# Patient Record
Sex: Male | Born: 1955 | Race: Black or African American | Hispanic: No | Marital: Married | Smoking: Never smoker
Health system: Southern US, Community
[De-identification: ages and names within clinical notes are randomized; demographics above are authoritative.]

## PROBLEM LIST (undated history)

## (undated) ENCOUNTER — Emergency Department (HOSPITAL_COMMUNITY): Admission: EM | Payer: Medicare HMO | Source: Home / Self Care

## (undated) DIAGNOSIS — C61 Malignant neoplasm of prostate: Secondary | ICD-10-CM

## (undated) HISTORY — PX: PROSTATE BIOPSY: SHX241

---

## 1998-10-21 ENCOUNTER — Other Ambulatory Visit: Admission: RE | Admit: 1998-10-21 | Discharge: 1998-10-21 | Payer: Self-pay | Admitting: Podiatry

## 2004-12-05 ENCOUNTER — Emergency Department (HOSPITAL_COMMUNITY): Admission: EM | Admit: 2004-12-05 | Discharge: 2004-12-05 | Payer: Self-pay | Admitting: Emergency Medicine

## 2006-09-20 ENCOUNTER — Emergency Department (HOSPITAL_COMMUNITY): Admission: EM | Admit: 2006-09-20 | Discharge: 2006-09-21 | Payer: Self-pay | Admitting: Emergency Medicine

## 2007-09-29 ENCOUNTER — Emergency Department (HOSPITAL_COMMUNITY): Admission: EM | Admit: 2007-09-29 | Discharge: 2007-09-29 | Payer: Self-pay | Admitting: Emergency Medicine

## 2011-11-23 ENCOUNTER — Emergency Department (HOSPITAL_COMMUNITY): Payer: Self-pay

## 2011-11-23 ENCOUNTER — Other Ambulatory Visit: Payer: Self-pay

## 2011-11-23 ENCOUNTER — Encounter (HOSPITAL_COMMUNITY): Payer: Self-pay | Admitting: Emergency Medicine

## 2011-11-23 ENCOUNTER — Emergency Department (HOSPITAL_COMMUNITY)
Admission: EM | Admit: 2011-11-23 | Discharge: 2011-11-23 | Disposition: A | Discharge status: Home Health | Payer: Self-pay | Attending: Emergency Medicine | Admitting: Emergency Medicine

## 2011-11-23 DIAGNOSIS — R200 Anesthesia of skin: Secondary | ICD-10-CM

## 2011-11-23 DIAGNOSIS — R209 Unspecified disturbances of skin sensation: Secondary | ICD-10-CM | POA: Insufficient documentation

## 2011-11-23 LAB — CBC
HCT: 44.2 % (ref 39.0–52.0)
Hemoglobin: 14.8 g/dL (ref 13.0–17.0)
MCH: 30.6 pg (ref 26.0–34.0)
MCHC: 33.5 g/dL (ref 30.0–36.0)
MCV: 91.3 fL (ref 78.0–100.0)
Platelets: 202 10*3/uL (ref 150–400)
RBC: 4.84 MIL/uL (ref 4.22–5.81)
RDW: 13.8 % (ref 11.5–15.5)
WBC: 6.5 10*3/uL (ref 4.0–10.5)

## 2011-11-23 LAB — BASIC METABOLIC PANEL
BUN: 14 mg/dL (ref 6–23)
CO2: 29 mEq/L (ref 19–32)
Calcium: 9.3 mg/dL (ref 8.4–10.5)
Chloride: 103 mEq/L (ref 96–112)
Creatinine, Ser: 1.07 mg/dL (ref 0.50–1.35)
GFR calc Af Amer: 87 mL/min — ABNORMAL LOW (ref >90)
GFR calc non Af Amer: 75 mL/min — ABNORMAL LOW (ref >90)
Glucose, Bld: 110 mg/dL — ABNORMAL HIGH (ref 70–99)
Potassium: 3.8 mEq/L (ref 3.5–5.1)
Sodium: 139 mEq/L (ref 135–145)

## 2011-11-23 NOTE — ED Notes (Signed)
Pt alert, nad, c/o facila numbness, eyes watering, onset several months ago, pt ambulates to triage, steady gait, noted, speech clear, grips equal

## 2011-11-23 NOTE — Discharge Instructions (Signed)
Neuropathy Neuropathy means your peripheral nerves are not working normally. Peripheral nerves are the nerves outside the brain and spinal cord. Messages between the brain and the rest of the body do not work properly with peripheral nerve disorders. CAUSES There are many different causes of peripheral nerve disorders. These include:  Injury.   Infections.   Diabetes.   Vitamin deficiency.   Poor circulation.   Alcoholism.   Exposure to toxins.   Drug effects.   Tumors.   Kidney disease.  SYMPTOMS  Tingling, burning, pain, and numbness in the extremities.   Weakness and loss of muscle tone and size.  DIAGNOSIS Blood tests and special studies of nerve function may help confirm the diagnosis.  TREATMENT  Treatment includes adopting healthy life habits.   A good diet, vitamin supplements, and mild pain medicine may be needed.   Avoid known toxins such as alcohol, tobacco, and recreational drugs.   Anti-convulsant medicines are helpful in some types of neuropathy.  Make a follow-up appointment with your caregiver to be sure you are getting better with treatment.  SEEK IMMEDIATE MEDICAL CARE IF:   You have breathing problems.   You have severe or uncontrolled pain.   You notice extreme weakness or you feel faint.   You are not better after 1 week or if you have worse symptoms.  Document Released: 10/19/2004 Document Revised: 05/24/2011 Document Reviewed: 09/11/2005 [facility] Patient Information 2012 [place], [place].  RESOURCE GUIDE  Dental Problems  Patients with Medicaid: [facility] [phone] [address]                                           [phone] [address] Phone:  [phone]                                                  Phone:  [phone]  If unable to pay or uninsured, contact:  [facility] or [facility]. to become qualified for the adult dental clinic.  Chronic Pain  Problems Contact [facility]  [phone] Patients need to be referred by their primary care doctor.  Insufficient Money for Medicine Contact [employer] Way:  call "211" or [facility] [phone].  No Primary Care Doctor Call [facility]  [phone] Other agencies that provide inexpensive medical care    [facility]  [phone]    [facility]  [phone]    [facility]  [phone]    [facility]  [phone]    [facility]  [phone]    [facility]  [phone]  Psychological Services [facility]  [phone] [facility] Services  [phone] [facility] Mental [facility]   [phone] (emergency services [phone])  Substance Abuse Resources [facility]  [phone] [facility] [phone] [facility] [phone] Floydene Flock [phone] Residential & Outpatient Substance Abuse Program  [phone]  Abuse/Neglect [facility] Child Abuse Hotline 480-267-6739 [facility] Child Abuse Hotline [phone] (After Hours)  Emergency Shelter [facility] [phone]  Maternity Homes [facility] at [facility] [place] [facility]  [place] [phone] [employer] Services [phone]  MRSA Hotline #:   727-803-9967    [facility] Resources  [facility]     [employer]                          [facility] Dept. [address] [place]                       [address]      [address]  Blondell Reveal Phone:  [phone]                                   Phone:  [phone]                 Phone:  [phone]  [facility] Mental Health Phone:  [phone]  [facility] Child Abuse Hotline 916-171-9086 [phone] (After Hours)

## 2011-11-23 NOTE — ED Notes (Signed)
Patient c/o left facial numbness in the cheek area. Trapezius area and left arm and elg.  \sts that this has been occurring for a long time.  Denies heavy lifting

## 2011-11-28 NOTE — ED Provider Notes (Signed)
History    56 year old male with multiple complaints. Patient has had less sided facial numbness for about 3 months. Is also complaining that his eyes are occasionally watery. Also some mild numbness in his left lower extremity. Patient denies trauma. Denies any significant pain anywhere. No tingling or loss of strength. Denies history of blood clot. Denies history of smoking. Denies history of stroke. No acute visual changes. No diplopia. Does not feel off balance when he walks. Denies drug use. Denies smoking history. Document for evaluation today because the encouragement of his significant other.   CSN: [id number]  Arrival date & time 11/23/11  2059   First MD Initiated Contact with Patient 11/23/11 2128      Chief Complaint  Patient presents with  . Numbness    (Consider location/radiation/quality/duration/timing/severity/associated sxs/prior treatment) HPI  History reviewed. No pertinent past medical history.  History reviewed. No pertinent past surgical history.  No family history on file.  History  Substance Use Topics  . Smoking status: Never Smoker   . Smokeless tobacco: Not on file  . Alcohol Use: No      Review of Systems   Review of symptoms negative unless otherwise noted in HPI.   Allergies  Review of patient's allergies indicates no known allergies.  Home Medications  No current outpatient prescriptions on file.  BP 169/83  Pulse 56  Temp 97.9 F (36.6 C)  Resp 16  Wt 220 lb (99.791 kg)  SpO2 93%  Physical Exam  Nursing note and vitals reviewed. Constitutional: He is oriented to person, place, and time. He appears well-developed and well-nourished. No distress.       Sitting up in bed. No acute distress.  HENT:  Head: Normocephalic and atraumatic.  Right Ear: External ear normal.  Left Ear: External ear normal.  Mouth/Throat: Oropharynx is clear and moist.  Eyes: Conjunctivae and EOM are normal. Pupils are equal, round, and reactive to  light. Right eye exhibits no discharge. Left eye exhibits no discharge. No scleral icterus.  Neck: Normal range of motion. Neck supple.  Cardiovascular: Normal rate, regular rhythm and normal heart sounds.  Exam reveals no gallop and no friction rub.   No murmur heard. Pulmonary/Chest: Effort normal and breath sounds normal. No respiratory distress.  Abdominal: Soft. He exhibits no distension. There is no tenderness.  Musculoskeletal: He exhibits no edema and no tenderness.  Lymphadenopathy:    He has no cervical adenopathy.  Neurological: He is alert and oriented to person, place, and time. No cranial nerve deficit. He exhibits normal muscle tone. Coordination normal.       Good finger to nose and heel to shin testing bilaterally. Negative Romberg's. Normal appearing gait. Sensation is grossly intact to light touch. Visual fields are intact to confrontation. Speech is clear and content is appropriate. Is able to correctly identify objects around the room  Skin: Skin is warm and dry. He is not diaphoretic.  Psychiatric: He has a normal mood and affect. His behavior is normal. Thought content normal.    ED Course  Procedures (including critical care time)  Labs Reviewed  BASIC METABOLIC PANEL - Abnormal; Notable for the following:    Glucose, Bld 110 (*)    GFR calc non Af Amer 75 (*)    GFR calc Af Amer 87 (*)    All other components within normal limits  CBC  LAB REPORT - SCANNED   No results found.  Ct Head Wo Contrast  11/23/2011  *RADIOLOGY REPORT*  Clinical  Data: Facial numbness; eyes watering.  CT HEAD WITHOUT CONTRAST  Technique:  Contiguous axial images were obtained from the base of the skull through the vertex without contrast.  Comparison: None.  Findings: There is no evidence of acute infarction, mass lesion, or intra- or extra-axial hemorrhage on CT.  The posterior fossa, including the cerebellum, brainstem and fourth ventricle, is within normal limits.  The third and  lateral ventricles, and basal ganglia are unremarkable in appearance.  The cerebral hemispheres are symmetric in appearance, with normal gray- white differentiation.  No mass effect or midline shift is seen.  There is no evidence of fracture; visualized osseous structures are unremarkable in appearance.  The orbits are within normal limits. The paranasal sinuses and mastoid air cells are well-aerated.  No significant soft tissue abnormalities are seen.  There appears to be a small anterior extension of the right parotid gland, within normal limits.  IMPRESSION: No acute intracranial pathology seen on CT.  Original Report Authenticated By: Tonia Ghent, M.D.    1. Numbness       MDM  56 year old male with left-sided facial numbness and left her son and numbness. Patient has a nonfocal neurological examination. CT of his head distention acute findings. Consider CVA. Given the chronicity and stability of patient's complaints though feel that he is appropriate for outpatient followup. Resources were provided. Stressed the importance of establishing a primary care Dr. Return precautions were discussed.        Raeford Razor, MD 11/28/11 2222

## 2013-12-30 ENCOUNTER — Encounter (HOSPITAL_COMMUNITY): Payer: Self-pay | Admitting: Emergency Medicine

## 2013-12-30 ENCOUNTER — Emergency Department (HOSPITAL_COMMUNITY): Payer: BC Managed Care – PPO

## 2013-12-30 ENCOUNTER — Emergency Department (HOSPITAL_COMMUNITY)
Admission: EM | Admit: 2013-12-30 | Discharge: 2013-12-30 | Disposition: A | Discharge status: Home / Self Care | Payer: BC Managed Care – PPO | Attending: Emergency Medicine | Admitting: Emergency Medicine

## 2013-12-30 DIAGNOSIS — Z23 Encounter for immunization: Secondary | ICD-10-CM | POA: Insufficient documentation

## 2013-12-30 DIAGNOSIS — S61209A Unspecified open wound of unspecified finger without damage to nail, initial encounter: Secondary | ICD-10-CM | POA: Insufficient documentation

## 2013-12-30 DIAGNOSIS — Y9389 Activity, other specified: Secondary | ICD-10-CM | POA: Insufficient documentation

## 2013-12-30 DIAGNOSIS — S61259A Open bite of unspecified finger without damage to nail, initial encounter: Secondary | ICD-10-CM

## 2013-12-30 DIAGNOSIS — Y929 Unspecified place or not applicable: Secondary | ICD-10-CM | POA: Insufficient documentation

## 2013-12-30 DIAGNOSIS — W540XXA Bitten by dog, initial encounter: Secondary | ICD-10-CM | POA: Insufficient documentation

## 2013-12-30 MED ORDER — TETANUS-DIPHTHERIA TOXOIDS TD 5-2 LFU IM INJ
0.5000 mL | INJECTION | Freq: Once | INTRAMUSCULAR | Status: AC
  Administered 2013-12-30: 0.5 mL via INTRAMUSCULAR
  Filled 2013-12-30: qty 0.5

## 2013-12-30 MED ORDER — AMOXICILLIN 500 MG PO CAPS: 500.0000 mg | ORAL_CAPSULE | Freq: Three times a day (TID) | ORAL | Status: DC

## 2013-12-30 NOTE — Discharge Instructions (Signed)
Animal Bite °An animal bite can result in a scratch on the skin, deep open cut, puncture of the skin, crush injury, or tearing away of the skin or a body part. Dogs are responsible for most animal bites. Children are bitten more often than adults. An animal bite can range from very mild to more serious. A small bite from your house pet is no cause for alarm. However, some animal bites can become infected or injure a bone or other tissue. You must seek medical care if: °· The skin is broken and bleeding does not slow down or stop after 15 minutes. °· The puncture is deep and difficult to clean (such as a cat bite). °· Pain, warmth, redness, or pus develops around the wound. °· The bite is from a stray animal or rodent. There may be a risk of rabies infection. °· The bite is from a snake, raccoon, skunk, fox, coyote, or bat. There may be a risk of rabies infection. °· The person bitten has a chronic illness such as diabetes, liver disease, or cancer, or the person takes medicine that lowers the immune system. °· There is concern about the location and severity of the bite. °It is important to clean and protect an animal bite wound right away to prevent infection. Follow these steps: °· Clean the wound with plenty of water and soap. °· Apply an antibiotic cream. °· Apply gentle pressure over the wound with a clean towel or gauze to slow or stop bleeding. °· Elevate the affected area above the heart to help stop any bleeding. °· Seek medical care. Getting medical care within 8 hours of the animal bite leads to the best possible outcome. °DIAGNOSIS  °Your caregiver will most likely: °· Take a detailed history of the animal and the bite injury. °· Perform a wound exam. °· Take your medical history. °Blood tests or X-rays may be performed. Sometimes, infected bite wounds are cultured and sent to a lab to identify the infectious bacteria.  °TREATMENT  °Medical treatment will depend on the location and type of animal bite as  well as the patient's medical history. Treatment may include: °· Wound care, such as cleaning and flushing the wound with saline solution, bandaging, and elevating the affected area. °· Antibiotics. °· Tetanus immunization. °· Rabies immunization. °· Leaving the wound open to heal. This is often done with animal bites, due to the high risk of infection. However, in certain cases, wound closure with stitches, wound adhesive, skin adhesive strips, or staples may be used. ° Infected bites that are left untreated may require intravenous (IV) antibiotics and surgical treatment in the hospital. °HOME CARE INSTRUCTIONS °· Follow your caregiver's instructions for wound care. °· Take all medicines as directed. °· If your caregiver prescribes antibiotics, take them as directed. Finish them even if you start to feel better. °· Follow up with your caregiver for further exams or immunizations as directed. °You may need a tetanus shot if: °· You cannot remember when you had your last tetanus shot. °· You have never had a tetanus shot. °· The injury broke your skin. °If you get a tetanus shot, your arm may swell, get red, and feel warm to the touch. This is common and not a problem. If you need a tetanus shot and you choose not to have one, there is a rare chance of getting tetanus. Sickness from tetanus can be serious. °SEEK MEDICAL CARE IF: °· You notice warmth, redness, soreness, swelling, pus discharge, or a bad   smell coming from the wound.  You have a red line on the skin coming from the wound.  You have a fever, chills, or a general ill feeling.  You have nausea or vomiting.  You have continued or worsening pain.  You have trouble moving the injured part.  You have other questions or concerns. MAKE SURE YOU:  Understand these instructions.  Will watch your condition.  Will get help right away if you are not doing well or get worse. Document Released: 05/30/2011 Document Revised: 12/04/2011 Document  Reviewed: 05/30/2011 [facility] Patient Information 2014 [place].   KEEP YOUR RIGHT HAND ELEVATED AS MUCH AS POSSIBLE.  RETURN TO THE ED IF YOU HAVE ANY SIGNS OR CONCERNS FOR WOUND INFECTION.

## 2013-12-30 NOTE — ED Notes (Signed)
Wound care applied to pt's finger.

## 2013-12-30 NOTE — ED Notes (Signed)
Pt. presents with laceration approx. 1 inch at right distal ring finger sustained this evening from his pet dog bite . Dog's immunizations are up to date .

## 2013-12-30 NOTE — ED Provider Notes (Signed)
CSN: [id number]     Arrival date & time 12/30/13  0028 History   First MD Initiated Contact with Patient 12/30/13 [phone]     Chief Complaint  Patient presents with  . Animal Bite     (Consider location/radiation/quality/duration/timing/severity/associated sxs/prior Treatment) HPI This patient is a pleasant right-hand-dominant man who presents with a dog bite to his right ring finger. The patient receiving ginger snaps to his beagle when the dog latched on to his finger. The patient reports mild to moderate throbbing pain in this region which is nonradiating and worse with certain movements.  Last tetanus is unknown. No other injuries.  History reviewed. No pertinent past medical history. History reviewed. No pertinent past surgical history. No family history on file. History  Substance Use Topics  . Smoking status: Never Smoker   . Smokeless tobacco: Not on file  . Alcohol Use: No    Review of Systems  Limited ROS unremarkable.     Allergies  Review of patient's allergies indicates no known allergies.  Home Medications   Current Outpatient Rx  Name  Route  Sig  Dispense  Refill  . amoxicillin (AMOXIL) 500 MG capsule   Oral   Take 1 capsule (500 mg total) by mouth 3 (three) times daily.   21 capsule   0    BP 135/88  Pulse 57  Temp(Src) 98.8 F (37.1 C) (Oral)  Resp 14  Ht 5\' 11"  (1.803 m)  Wt 229 lb (103.874 kg)  BMI 31.95 kg/m2  SpO2 96% Physical Exam Gen: well developed and well nourished appearing Head: NCAT Eyes: PERL, EOMI Ext: 2.5cm linear and full thickness laceration to volar surface of the right ring finger, cap refill < 2s, FROM at MCP, PIP, DIP joints, sensation intact to light touch throughout Neuro: CN ii-xii grossly intact, no focal deficits Psyche; normal affect,  calm and cooperative.  ED Course  Procedures (including critical care time) Labs Review Labs Reviewed - No data to display Imaging Review Dg Finger Ring Right  12/30/2013    CLINICAL DATA:  ANIMAL BITE  EXAM: RIGHT RING FINGER 2+V  COMPARISON:  None.  FINDINGS: There is no evidence of fracture or dislocation. There is no evidence of arthropathy or other focal bone abnormality. Soft tissues are nonsuspicious; overlying bandage subcutaneous gas or radiopaque foreign bodies.  IMPRESSION: Negative.   Electronically Signed   By: Elon Alas   On: 12/30/2013 01:24   LACERATION REPAIR Performed by: Elyn Peers Authorized by: Elyn Peers Consent: Verbal consent obtained. Risks and benefits: risks, benefits and alternatives were discussed Consent given by: patient Patient identity confirmed: provided demographic data Prepped and Draped in normal sterile fashion Wound explored  Laceration Location: right ring finer  Laceration Length: 2.5cm  No Foreign Bodies seen or palpated  Anesthesia: local infiltration  Local anesthetic: lidocaine 1% no epinephrine  Anesthetic total: 2 ml  Irrigation method: syringe Amount of cleaning: standard  Skin closure: yes  Number of sutures: 4  Technique: interrupted  Patient tolerance: Patient tolerated the procedure well with no immediate complications.   MDM   Final diagnoses:  Dog bite of finger   Laceration repaired without complications. Td updated. Patient educated re: wound care and plan for suture removal with pcp or urgent care.     Elyn Peers, MD 12/30/13 0600

## 2013-12-30 NOTE — ED Notes (Signed)
Pt states he was bit by his dog tonight and is having throbbing pain in his right ring finger.  No other complaints at this time

## 2015-12-23 ENCOUNTER — Emergency Department (HOSPITAL_COMMUNITY)
Admission: EM | Admit: 2015-12-23 | Discharge: 2015-12-23 | Disposition: A | Discharge status: Home / Self Care | Payer: Self-pay | Attending: Emergency Medicine | Admitting: Emergency Medicine

## 2015-12-23 ENCOUNTER — Emergency Department (HOSPITAL_COMMUNITY): Payer: Self-pay

## 2015-12-23 ENCOUNTER — Encounter (HOSPITAL_COMMUNITY): Payer: Self-pay

## 2015-12-23 DIAGNOSIS — R69 Illness, unspecified: Secondary | ICD-10-CM

## 2015-12-23 DIAGNOSIS — J111 Influenza due to unidentified influenza virus with other respiratory manifestations: Secondary | ICD-10-CM | POA: Insufficient documentation

## 2015-12-23 DIAGNOSIS — Z792 Long term (current) use of antibiotics: Secondary | ICD-10-CM | POA: Insufficient documentation

## 2015-12-23 DIAGNOSIS — D72819 Decreased white blood cell count, unspecified: Secondary | ICD-10-CM | POA: Insufficient documentation

## 2015-12-23 DIAGNOSIS — R7989 Other specified abnormal findings of blood chemistry: Secondary | ICD-10-CM | POA: Insufficient documentation

## 2015-12-23 DIAGNOSIS — R630 Anorexia: Secondary | ICD-10-CM | POA: Insufficient documentation

## 2015-12-23 LAB — BASIC METABOLIC PANEL
Anion gap: 8 (ref 5–15)
BUN: 17 mg/dL (ref 6–20)
CHLORIDE: 100 mmol/L — AB (ref 101–111)
CO2: 29 mmol/L (ref 22–32)
CREATININE: 1.5 mg/dL — AB (ref 0.61–1.24)
Calcium: 8.8 mg/dL — ABNORMAL LOW (ref 8.9–10.3)
GFR calc Af Amer: 56 mL/min — ABNORMAL LOW (ref >60)
GFR calc non Af Amer: 49 mL/min — ABNORMAL LOW (ref >60)
GLUCOSE: 101 mg/dL — AB (ref 65–99)
Potassium: 3.5 mmol/L (ref 3.5–5.1)
SODIUM: 137 mmol/L (ref 135–145)

## 2015-12-23 LAB — CBC
HEMATOCRIT: 46.8 % (ref 39.0–52.0)
Hemoglobin: 15.5 g/dL (ref 13.0–17.0)
MCH: 30.9 pg (ref 26.0–34.0)
MCHC: 33.1 g/dL (ref 30.0–36.0)
MCV: 93.2 fL (ref 78.0–100.0)
PLATELETS: 118 10*3/uL — AB (ref 150–400)
RBC: 5.02 MIL/uL (ref 4.22–5.81)
RDW: 13.8 % (ref 11.5–15.5)
WBC: 2.9 10*3/uL — AB (ref 4.0–10.5)

## 2015-12-23 LAB — TROPONIN I: Troponin I: <0.03 ng/mL (ref <0.031)

## 2015-12-23 MED ORDER — SODIUM CHLORIDE 0.9 % IV BOLUS (SEPSIS)
1000.0000 mL | Freq: Once | INTRAVENOUS | Status: AC
  Administered 2015-12-23: 1000 mL via INTRAVENOUS

## 2015-12-23 NOTE — ED Provider Notes (Signed)
CSN: [id number]     Arrival date & time 12/23/15  1632 History   First MD Initiated Contact with Patient 12/23/15 1821     Chief Complaint  Patient presents with  . Chest Pain     (Consider location/radiation/quality/duration/timing/severity/associated sxs/prior Treatment) HPI 60 year old male comes today complaining of cough, fever, and muscle aches for the past week. He has had subjective fever and chills at home. Nurse's notes state left-sided chest pain that He points to an area and axilla which he states have some sharp pains.  He has been nauseated and has had a poor appetite but has not been vomiting.  He states he feels he is dehydrated.  He has had some headache, but no neck pain or stiffness.  He is not dyspneic and denies abdominal pain or red or warm areas of his extremities but has pain in all four extremities. No known sick contacts.  He began with sneezing and sore throat , but these symptoms have improved.  History reviewed. No pertinent past medical history. History reviewed. No pertinent past surgical history. No family history on file. Social History  Substance Use Topics  . Smoking status: Never Smoker   . Smokeless tobacco: None  . Alcohol Use: No    Review of Systems  All other systems reviewed and are negative.     Allergies  Review of patient's allergies indicates no known allergies.  Home Medications   Prior to Admission medications   Medication Sig Start Date End Date Taking? Authorizing Provider  amoxicillin (AMOXIL) 500 MG capsule Take 1 capsule (500 mg total) by mouth 3 (three) times daily. 12/30/13   Elyn Peers, MD   BP 144/90 mmHg  Pulse 82  Temp(Src) 98.6 F (37 C) (Oral)  Resp 26  SpO2 93% Physical Exam  Constitutional: He is oriented to person, place, and time. He appears well-developed and well-nourished.  HENT:  Head: Normocephalic and atraumatic.  Right Ear: External ear normal.  Left Ear: External ear normal.  Nose: Nose normal.   Mouth/Throat: Oropharynx is clear and moist.  Eyes: Conjunctivae and EOM are normal. Pupils are equal, round, and reactive to light.  Neck: Normal range of motion. Neck supple.  Cardiovascular: Normal rate, regular rhythm, normal heart sounds and intact distal pulses.   Pulmonary/Chest: Effort normal and breath sounds normal. No respiratory distress. He has no wheezes. He exhibits no tenderness.  Abdominal: Soft. Bowel sounds are normal. He exhibits no distension and no mass. There is no tenderness. There is no guarding.  Musculoskeletal: Normal range of motion.  Neurological: He is alert and oriented to person, place, and time. He has normal reflexes. He exhibits normal muscle tone. Coordination normal.  Skin: Skin is warm and dry.  Psychiatric: He has a normal mood and affect. His behavior is normal. Judgment and thought content normal.  Nursing note and vitals reviewed.   ED Course  Procedures (including critical care time) Labs Review Labs Reviewed  BASIC METABOLIC PANEL - Abnormal; Notable for the following:    Chloride 100 (*)    Glucose, Bld 101 (*)    Creatinine, Ser 1.50 (*)    Calcium 8.8 (*)    GFR calc non Af Amer 49 (*)    GFR calc Af Amer 56 (*)    All other components within normal limits  CBC - Abnormal; Notable for the following:    WBC 2.9 (*)    Platelets 118 (*)    All other components within normal limits  TROPONIN I  Randolm Idol, ED    Imaging Review Dg Chest 2 View  12/23/2015  CLINICAL DATA:  60 year old male with left chest pain, cough, shortness of breath and headache for 4 days. Initial encounter. EXAM: CHEST  2 VIEW COMPARISON:  None. FINDINGS: Lung volumes are normal. Mild tortuosity of the thoracic aorta. Other mediastinal contours are within normal limits. Visualized tracheal air column is within normal limits. No pneumothorax, pulmonary edema, pleural effusion or confluent pulmonary opacity. No acute osseous abnormality identified. Negative  visible bowel gas pattern. IMPRESSION: No acute cardiopulmonary abnormality. Mild tortuosity of the thoracic aorta. Electronically Signed   By: Genevie Ann M.D.   On: 12/23/2015 17:06   I have personally reviewed and evaluated these images and lab results as part of my medical decision-making.   EKG Interpretation   Date/Time:  Thursday December 23 2015 16:38:38 EDT Ventricular Rate:  90 PR Interval:  154 QRS Duration: 96 QT Interval:  364 QTC Calculation: 445 R Axis:   82 Text Interpretation:  Normal sinus rhythm Possible Anterior infarct , age  undetermined Abnormal ECG Confirmed by Thelia Tanksley MD, Andee Poles [phone]) on  12/23/2015 7:16:10 PM      MDM   Final diagnoses:  Influenza-like illness  Elevated serum creatinine  Leukopenia    Patient with influenza like illness with decreased wbc count consistent with viral infection.  His creatinine is elevated c.w. Dehydration.  Patient given one liter iv fluid and states he feels greatly improved.  He is advised to have f/u to recheck wbc, creatinine.  Voices understanding.     Pattricia Boss, MD 12/23/15 [phone]

## 2015-12-23 NOTE — ED Notes (Signed)
Patient here with left sided CP x 1 week, describes the pain as sharp and intermittent, also reports some cramping to hands, shortness of breath with same

## 2015-12-23 NOTE — Discharge Instructions (Signed)
You appear to have a viral influenza like illness. Your white blood cell count is low which can happen with these infections.  Your kidney function is also slightly impaired consistent with dehydration.  Take tylenol, drink plenty of fluids and return if you are worse such as high fever, unable to drink fluids, or short of breath.  You will need to be seen next week in follow up and have your labs rechecked.

## 2019-06-17 ENCOUNTER — Other Ambulatory Visit: Payer: Self-pay

## 2019-06-17 DIAGNOSIS — Z20822 Contact with and (suspected) exposure to covid-19: Secondary | ICD-10-CM

## 2019-06-19 LAB — NOVEL CORONAVIRUS, NAA: SARS-CoV-2, NAA: NOT DETECTED

## 2020-06-28 ENCOUNTER — Other Ambulatory Visit: Payer: Self-pay | Admitting: Family

## 2020-06-28 ENCOUNTER — Ambulatory Visit
Admission: RE | Admit: 2020-06-28 | Discharge: 2020-06-28 | Disposition: A | Discharge status: Home / Self Care | Payer: Self-pay | Source: Ambulatory Visit | Attending: Family | Admitting: Family

## 2020-06-28 DIAGNOSIS — M25562 Pain in left knee: Secondary | ICD-10-CM

## 2020-08-25 ENCOUNTER — Ambulatory Visit: Payer: Medicare HMO | Admitting: Podiatry

## 2020-08-25 ENCOUNTER — Other Ambulatory Visit: Payer: Self-pay

## 2020-08-25 ENCOUNTER — Encounter: Payer: Self-pay | Admitting: Podiatry

## 2020-08-25 DIAGNOSIS — L600 Ingrowing nail: Secondary | ICD-10-CM

## 2020-08-25 NOTE — Progress Notes (Signed)
Subjective:   Patient ID: Roger Burgess, male   DOB: 64 y.o.   MRN: [id number]   HPI Patient presents stating he has had painful ingrown toenails of his left and right big toes with the right one being the entire nail which is thickened and damaged in his left being the corner.  States he gets pedicures and they only help temporarily and they are not getting better anymore and it is becoming increasingly painful for him.  Patient does not smoke likes to be active   Review of Systems  All other systems reviewed and are negative.       Objective:  Physical Exam Vitals and nursing note reviewed.  Constitutional:      Appearance: He is well-developed.  Pulmonary:     Effort: Pulmonary effort is normal.  Musculoskeletal:        General: Normal range of motion.  Skin:    General: Skin is warm.  Neurological:     Mental Status: He is alert.     Neurovascular status intact muscle strength found to be adequate range of motion adequate.  Patient is found to have a very thickened right hallux nail dystrophic painful when pressed and on the left hallux lateral border very painful and inability to wear shoe gear comfortably.  Patient has slight redness no active drainage noted and has nail disease on the other nails but not painful.  Patient has good digital perfusion well oriented x3     Assessment:  Chronic ingrown toenail deformity hallux bilateral thickened hallux nail right lateral border left hallux     Plan:  H&P all conditions reviewed explained and discussed.  We reviewed different treatment options patient is opted for permanent procedure.  I recommended total removal right nail and partial of the left and I allowed patient to read consent form going over risks and he signed after review.  Today I infiltrated each hallux 60 mg like Marcaine mixture sterile preps applied and using sterile instrumentation I remove the hallux nail right lateral border left hallux exposed matrix  and applied phenol 5 applications right 3 applications left of 81WE applications followed by alcohol lavage sterile dressing.  Gave instructions on soaks and advised this patient on what to do if any throbbing were to occur and to remove the dressings.  Patient is encouraged to call with questions concerns and I debrided his remaining nails which can be done periodically

## 2020-08-25 NOTE — Patient Instructions (Signed)
Place 1/4 cup of epsom salts in a quart of warm tap water.  Submerge your foot or feet in the solution and soak for 20 minutes.  This soak should be done twice a day.  Next, remove your foot or feet from solution, blot dry the affected area. Apply ointment and cover if instructed by your doctor.   IF YOUR SKIN BECOMES IRRITATED WHILE USING THESE INSTRUCTIONS, IT IS OKAY TO SWITCH TO  WHITE VINEGAR AND WATER.  As another alternative soak, you may use antibacterial soap and water.  Monitor for any signs/symptoms of infection. Call the office immediately if any occur or go directly to the emergency room. Call with any questions/concerns.  Ingrown Toenail An ingrown toenail occurs when the corner or sides of a toenail grow into the surrounding skin. This causes discomfort and pain. The big toe is most commonly affected, but any of the toes can be affected. If an ingrown toenail is not treated, it can become infected. What are the causes? This condition may be caused by:  Wearing shoes that are too small or tight.  An injury, such as stubbing your toe or having your toe stepped on.  Improper cutting or care of your toenails.  Having nail or foot abnormalities that were present from birth (congenital abnormalities), such as having a nail that is too big for your toe. What increases the risk? The following factors may make you more likely to develop ingrown toenails:  Age. Nails tend to get thicker with age, so ingrown nails are more common among older people.  Cutting your toenails incorrectly, such as cutting them very short or cutting them unevenly. An ingrown toenail is more likely to get infected if you have:  Diabetes.  Blood flow (circulation) problems. What are the signs or symptoms? Symptoms of an ingrown toenail may include:  Pain, soreness, or tenderness.  Redness.  Swelling.  Hardening of the skin that surrounds the toenail. Signs that an ingrown toenail may be infected  include:  Fluid or pus.  Symptoms that get worse instead of better. How is this diagnosed? An ingrown toenail may be diagnosed based on your medical history, your symptoms, and a physical exam. If you have fluid or blood coming from your toenail, a sample may be collected to test for the specific type of bacteria that is causing the infection. How is this treated? Treatment depends on how severe your ingrown toenail is. You may be able to care for your toenail at home.  If you have an infection, you may be prescribed antibiotic medicines.  If you have fluid or pus draining from your toenail, your health care provider may drain it.  If you have trouble walking, you may be given crutches to use.  If you have a severe or infected ingrown toenail, you may need a procedure to remove part or all of the nail. Follow these instructions at home: Foot care   Do not pick at your toenail or try to remove it yourself.  Soak your foot in warm, soapy water. Do this for 20 minutes, 3 times a day, or as often as told by your health care provider. This helps to keep your toe clean and keep your skin soft.  Wear shoes that fit well and are not too tight. Your health care provider may recommend that you wear open-toed shoes while you heal.  Trim your toenails regularly and carefully. Cut your toenails straight across to prevent injury to the skin at the   corners of the toenail. Do not cut your nails in a curved shape.  Keep your feet clean and dry to help prevent infection. Medicines  Take over-the-counter and prescription medicines only as told by your health care provider.  If you were prescribed an antibiotic, take it as told by your health care provider. Do not stop taking the antibiotic even if you start to feel better. Activity  Return to your normal activities as told by your health care provider. Ask your health care provider what activities are safe for you.  Avoid activities that cause  pain. General instructions  If your health care provider told you to use crutches to help you move around, use them as instructed.  Keep all follow-up visits as told by your health care provider. This is important. Contact a health care provider if:  You have more redness, swelling, pain, or other symptoms that do not improve with treatment.  You have fluid, blood, or pus coming from your toenail. Get help right away if:  You have a red streak on your skin that starts at your foot and spreads up your leg.  You have a fever. Summary  An ingrown toenail occurs when the corner or sides of a toenail grow into the surrounding skin. This causes discomfort and pain. The big toe is most commonly affected, but any of the toes can be affected.  If an ingrown toenail is not treated, it can become infected.  Fluid or pus draining from your toenail is a sign of infection. Your health care provider may need to drain it. You may be given antibiotics to treat the infection.  Trimming your toenails regularly and properly can help you prevent an ingrown toenail. This information is not intended to replace advice given to you by your health care provider. Make sure you discuss any questions you have with your health care provider. Document Revised: 01/03/2019 Document Reviewed: 05/30/2017 Elsevier Patient Education  2020 [employer]vier Inc[employer]  

## 2020-12-17 ENCOUNTER — Encounter: Payer: Self-pay | Admitting: Radiation Oncology

## 2020-12-17 NOTE — Progress Notes (Signed)
GU Location of Tumor / Histology: prostatic adenocarcinoma  If Prostate Cancer, Gleason Score is (3 + 4) and PSA is (14.90). Prostate volume: 58.45 grams  Roger Burgess presented as a referral from his PCP, Dustin Folks, Jr for further evaluation of an elevated PSA.  Biopsies of prostate (if applicable) revealed:   Past/Anticipated interventions by urology, if any: prostate biopsy, referral to Dr. Gloriann Loan to discuss RALP, referral to Dr. Tammi Klippel to discuss radiation options  Past/Anticipated interventions by medical oncology, if any: no  Weight changes, if any: denies  Bowel/Bladder complaints, if any: IPSS 13. SHIM 16. Denies dysuria, hematuria, urinary leakage or incontinence. Denies any bowel complaints.   Nausea/Vomiting, if any: denies  Pain issues, if any:  left knee related to arthritis  SAFETY ISSUES: Prior radiation? denies Pacemaker/ICD? denies Possible current pregnancy? no, male patient Is the patient on methotrexate? no  Current Complaints / other details:  65 year old male. Married with 1 son and 1 daughter. Resides in [place]. Mat. GF hx of prostate ca.

## 2020-12-20 ENCOUNTER — Other Ambulatory Visit: Payer: Self-pay

## 2020-12-20 ENCOUNTER — Encounter: Payer: Self-pay | Admitting: Medical Oncology

## 2020-12-20 ENCOUNTER — Ambulatory Visit: Payer: Medicare HMO

## 2020-12-20 ENCOUNTER — Ambulatory Visit
Admission: RE | Admit: 2020-12-20 | Discharge: 2020-12-20 | Disposition: A | Discharge status: Home / Self Care | Payer: Medicare HMO | Source: Ambulatory Visit | Attending: Radiation Oncology | Admitting: Radiation Oncology

## 2020-12-20 ENCOUNTER — Encounter: Payer: Self-pay | Admitting: Radiation Oncology

## 2020-12-20 VITALS — BP 151/86 | HR 54 | Temp 98.2°F | Resp 20 | Ht 71.0 in | Wt 235.4 lb

## 2020-12-20 DIAGNOSIS — C61 Malignant neoplasm of prostate: Secondary | ICD-10-CM | POA: Insufficient documentation

## 2020-12-20 DIAGNOSIS — Z79899 Other long term (current) drug therapy: Secondary | ICD-10-CM | POA: Diagnosis not present

## 2020-12-20 HISTORY — DX: Malignant neoplasm of prostate: C61

## 2020-12-20 NOTE — Progress Notes (Signed)
Introduced myself to patient and his wife as the prostate nurse navigator and discussed my role. No barriers to care identified at this time. He is here to discuss his radiation treatment options and will meet to Dr. Gloriann Loan to discuss surgery.  I gave them my business card and asked them to call me with questions or concerns. They voiced understanding.

## 2020-12-20 NOTE — Progress Notes (Signed)
Radiation Oncology         [phone]) [phone] ________________________________  Initial outpatient Consultation  Name: Roger Burgess MRN: [id number]  Date: 12/20/2020  DOB: Apr 14, 1956  YS:[id number], No Pcp Per  Davis Gourd*   REFERRING PHYSICIAN: Davis Gourd*  DIAGNOSIS: 65 y.o. gentleman with stage T1c adenocarcinoma of the prostate with a Gleason's score of 3+4 and a PSA of 14.3    ICD-10-CM   1. Malignant neoplasm of prostate ([place])  C61     HISTORY OF PRESENT ILLNESS::Roger Burgess is a 65 y.o. gentleman.  He was noted to have an elevated PSA of 11.84 and then 14.33 by his primary care physician, Dr. Dustin Folks.  Accordingly, he was referred for evaluation in urology by Dr. Lovena Neighbours on 09/30/20,  digital rectal examination was performed at that time revealing no nodules.  The patient proceeded to transrectal ultrasound with 12 biopsies of the prostate on 12/06/20.  The prostate volume measured 58 cc.  Out of 12 core biopsies, 3 were positive.  The maximum Gleason score was 3+4, and this was seen in the right mid-gland with 3+3 in the right apex and left lateral apex.    The patient reviewed the biopsy results with his urologist and he has kindly been referred today for discussion of potential radiation treatment options.  PREVIOUS RADIATION THERAPY: No  PAST MEDICAL HISTORY:  has a past medical history of Prostate cancer ([place]).    PAST SURGICAL HISTORY: Past Surgical History:  Procedure Laterality Date  . PROSTATE BIOPSY      FAMILY HISTORY: family history includes Prostate cancer in his maternal grandfather.  SOCIAL HISTORY:  reports that he has never smoked. He has never used smokeless tobacco. He reports that he does not drink alcohol and does not use drugs.  ALLERGIES: Patient has no known allergies.  MEDICATIONS:  Current Outpatient Medications  Medication Sig Dispense Refill  . cetirizine (ZYRTEC) 10 MG tablet Take 10 mg by mouth daily.     Marland Kitchen ibuprofen (ADVIL,MOTRIN) 200 MG tablet Take 600 mg by mouth every 6 (six) hours as needed for fever or moderate pain.    . meloxicam (MOBIC) 15 MG tablet      No current facility-administered medications for this encounter.    REVIEW OF SYSTEMS:  A 15 point review of systems is documented in the electronic medical record. This was obtained by the nursing staff. However, I reviewed this with the patient to discuss relevant findings and make appropriate changes.  Pertinent items are noted in HPI..  The patient completed an IPSS and IIEF questionnaire.  His IPSS score was 16 indicating moderate urinary outflow obstructive symptoms.  He indicated that his erectile function is able to complete sexual activity on most attempts.   PHYSICAL EXAM: This patient is in no acute distress.  He is alert and oriented.   height is 5\' 11"  (1.803 m) and weight is 235 lb 6.4 oz (106.8 kg). His temperature is 98.2 F (36.8 C). His blood pressure is 151/86 (abnormal) and his pulse is 54 (abnormal). His respiration is 20 and oxygen saturation is 96%.  He exhibits no respiratory distress or labored breathing.  He appears neurologically intact.  His mood is pleasant.  His affect is appropriate.  Please note the digital rectal exam findings described above.  KPS = 100  100 - Normal; no complaints; no evidence of disease. 90   - Able to carry on normal activity; minor signs or symptoms of disease. 80   -  Normal activity with effort; some signs or symptoms of disease. 69   - Cares for self; unable to carry on normal activity or to do active work. 60   - Requires occasional assistance, but is able to care for most of his personal needs. 50   - Requires considerable assistance and frequent medical care. 53   - Disabled; requires special care and assistance. 2   - Severely disabled; hospital admission is indicated although death not imminent. 20   - Very sick; hospital admission necessary; active supportive treatment  necessary. 10   - Moribund; fatal processes progressing rapidly. 0     - Dead  Karnofsky DA, Abelmann [place], Craver LS and Burchenal [facility] [phone]) The use of the nitrogen mustards in the palliative treatment of carcinoma: with particular reference to bronchogenic carcinoma Cancer 1 [phone]   LABORATORY DATA:  Lab Results  Component Value Date   WBC 2.9 (L) 12/23/2015   HGB 15.5 12/23/2015   HCT 46.8 12/23/2015   MCV 93.2 12/23/2015   PLT 118 (L) 12/23/2015   Lab Results  Component Value Date   NA 137 12/23/2015   K 3.5 12/23/2015   CL 100 (L) 12/23/2015   CO2 29 12/23/2015   No results found for: ALT, AST, GGT, ALKPHOS, BILITOT   RADIOGRAPHY: No results found.    IMPRESSION: This patient is a 65 y.o. gentleman with stage T1c adenocarcinoma of the prostate with a Gleason's score of 3+4 and a PSA of 14.3.  His T-Stage, Gleason's Score, and PSA put him into the favorable intermediate risk group.  Accordingly he is eligible for a variety of potential treatment options including prostatectomy, external radiation and seed implant.  PLAN:Today I reviewed the findings and workup thus far.  We discussed the natural history of prostate cancer.  We reviewed the the implications of T-stage, Gleason's Score, and PSA on decision-making and outcomes in prostate cancer.  We discussed radiation treatment in the management of prostate cancer with regard to the logistics and delivery of external beam radiation treatment as well as the logistics and delivery of prostate brachytherapy.  We compared and contrasted each of these approaches and also compared these against prostatectomy.  The patient expressed interest in prostate brachytherapy.  I filled out a patient counseling form for him with relevant treatment diagrams and we retained a copy for our records.  He had a friend who got seed implant and is happy and an uncle who decided to do nothing and is happy.  The patient remained undecided about his choice.   But, he has ruled out prostatectomy and external radiation.  He indicated that he is considering seed implant versus active surveillance.  If he decides on seeds and would like to proceed with prostate brachytherapy.  I will share my findings with Dr. Lovena Neighbours and move forward with scheduling the procedure in the near future.     I enjoyed meeting with him today, and will look forward to participating in the care of this very nice gentleman.   I spent 60 minutes in total on this encounter.   ------------------------------------------------  Sheral Apley Tammi Klippel, M.D.

## 2020-12-21 NOTE — Addendum Note (Signed)
Encounter addended by: Heywood Footman, RN on: 12/21/2020 12:36 PM  Actions taken: Order Reconciliation Section accessed, Medication List reviewed, Home Medications modified

## 2021-01-31 ENCOUNTER — Encounter: Payer: Self-pay | Admitting: Medical Oncology

## 2021-01-31 NOTE — Progress Notes (Signed)
Attempted to reach patient to follow up on treatment decision. No answer and mailbox full.

## 2021-02-01 ENCOUNTER — Encounter: Payer: Self-pay | Admitting: Medical Oncology

## 2021-02-01 NOTE — Progress Notes (Signed)
Spoke with patient and he has decided on brachytherapy as treatment. I informed him that Enid Derry will reach out to him with appointment information.He voiced understanding.  Dr. Tammi Klippel, Ailene Ards, [place] and [place] notified of decision.

## 2021-02-03 ENCOUNTER — Telehealth: Payer: Self-pay | Admitting: *Deleted

## 2021-02-03 NOTE — Telephone Encounter (Signed)
Called patient to ask questions, spoke with patient 

## 2021-02-04 ENCOUNTER — Other Ambulatory Visit: Payer: Self-pay | Admitting: Urology

## 2021-02-04 DIAGNOSIS — C61 Malignant neoplasm of prostate: Secondary | ICD-10-CM

## 2021-02-08 ENCOUNTER — Telehealth: Payer: Self-pay | Admitting: *Deleted

## 2021-02-08 NOTE — Telephone Encounter (Signed)
Called patient to inform of pre-seed appts. for 03-24-21 and his implant  date for 04-15-21, spoke with patient and he is aware of these appts.

## 2021-03-23 ENCOUNTER — Telehealth: Payer: Self-pay | Admitting: *Deleted

## 2021-03-23 NOTE — Telephone Encounter (Signed)
CALLED PATIENT TO REMIND OF PRE-SEED APPTS. FOR 03-24-21, SPOKE WITH PATIENT AND HE IS AWARE OF THESE APPTS.

## 2021-03-24 ENCOUNTER — Ambulatory Visit (HOSPITAL_COMMUNITY)
Admission: RE | Admit: 2021-03-24 | Discharge: 2021-03-24 | Disposition: A | Discharge status: Home / Self Care | Payer: Medicare HMO | Source: Ambulatory Visit | Attending: Urology | Admitting: Urology

## 2021-03-24 ENCOUNTER — Other Ambulatory Visit: Payer: Self-pay

## 2021-03-24 ENCOUNTER — Ambulatory Visit
Admission: RE | Admit: 2021-03-24 | Discharge: 2021-03-24 | Disposition: A | Discharge status: Home / Self Care | Payer: Medicare HMO | Source: Ambulatory Visit | Attending: Urology | Admitting: Urology

## 2021-03-24 ENCOUNTER — Ambulatory Visit
Admission: RE | Admit: 2021-03-24 | Discharge: 2021-03-24 | Disposition: A | Discharge status: Home / Self Care | Payer: Medicare HMO | Source: Ambulatory Visit | Attending: Radiation Oncology | Admitting: Radiation Oncology

## 2021-03-24 ENCOUNTER — Encounter: Payer: Self-pay | Admitting: Urology

## 2021-03-24 ENCOUNTER — Encounter (HOSPITAL_COMMUNITY)
Admission: RE | Admit: 2021-03-24 | Discharge: 2021-03-24 | Disposition: A | Discharge status: Home / Self Care | Payer: Medicare HMO | Source: Ambulatory Visit | Attending: Urology | Admitting: Urology

## 2021-03-24 DIAGNOSIS — C61 Malignant neoplasm of prostate: Secondary | ICD-10-CM

## 2021-03-24 NOTE — Progress Notes (Signed)
Patient and wife in for pre seed appointment. Denies any pain.

## 2021-03-24 NOTE — Progress Notes (Signed)
  Radiation Oncology         [phone]) [phone] ________________________________  Name: Roger Burgess MRN: [id number]  Date: 03/24/2021  DOB: 1956/08/24  SIMULATION AND TREATMENT PLANNING NOTE PUBIC ARCH STUDY  ON:[id number], No Pcp Per (Inactive)  Davis Gourd*  DIAGNOSIS: 65 y.o. gentleman with stage T1c adenocarcinoma of the prostate with a Gleason's score of 3+4 and a PSA of 14.3  Oncology History   No history exists.      ICD-10-CM   1. Malignant neoplasm of prostate ([place])  C61       COMPLEX SIMULATION:  The patient presented today for evaluation for possible prostate seed implant. He was brought to the radiation planning suite and placed supine on the CT couch. A 3-dimensional image study set was obtained in upload to the planning computer. There, on each axial slice, I contoured the prostate gland. Then, using three-dimensional radiation planning tools I reconstructed the prostate in view of the structures from the transperineal needle pathway to assess for possible pubic arch interference. In doing so, I did not appreciate any pubic arch interference. Also, the patient's prostate volume was estimated based on the drawn structure. The volume was 58 cc.  Given the pubic arch appearance and prostate volume, patient remains a good candidate to proceed with prostate seed implant. Today, he freely provided informed written consent to proceed.    PLAN: The patient will undergo prostate seed implant.   ________________________________  Sheral Apley. Tammi Klippel, M.D.

## 2021-03-31 ENCOUNTER — Telehealth: Payer: Self-pay | Admitting: *Deleted

## 2021-03-31 NOTE — Telephone Encounter (Signed)
CALLED PATIENT TO REMIND OF LAB FOR 04-12-21, UNABLE TO LEAVE MESSAGE DUE TO VM BEING FULL

## 2021-04-11 ENCOUNTER — Other Ambulatory Visit: Payer: Self-pay

## 2021-04-11 ENCOUNTER — Encounter (HOSPITAL_BASED_OUTPATIENT_CLINIC_OR_DEPARTMENT_OTHER): Payer: Self-pay | Admitting: Urology

## 2021-04-11 ENCOUNTER — Telehealth: Payer: Self-pay | Admitting: *Deleted

## 2021-04-11 DIAGNOSIS — Z972 Presence of dental prosthetic device (complete) (partial): Secondary | ICD-10-CM

## 2021-04-11 HISTORY — DX: Presence of dental prosthetic device (complete) (partial): Z97.2

## 2021-04-11 NOTE — Telephone Encounter (Signed)
Called patient to remind of labs for 04-12-21 @ 9 am, spoke with patient and he is aware of this appt.

## 2021-04-11 NOTE — Progress Notes (Signed)
Spoke w/ via phone for pre-op interview---pt Lab needs dos----  none             Lab results------ekg 03-24-2021 eoic, chest xray 03-24-2021 epic, has lab appt  04-22-2021 for cbc cmp pt ptt COVID test -----patient states asymptomatic no test needed Arrive at -------1015 am 04-15-2021 NPO after MN NO Solid Food.  Clear liquids from MN until---915 am then npo Med rec completed Medications to take morning of surgery -----pantaprazole Diabetic medication -----n/a Patient instructed no nail polish to be worn day of surgery Patient instructed to bring photo id and insurance card day of surgery Patient aware to have Driver (ride ) / caregiver   Roger Burgess will stay  for 24 hours after surgery  Patient Special Instructions -----fleets enema am of surgery Pre-Op special Istructions -----none Patient verbalized understanding of instructions that were given at this phone interview. Patient denies shortness of breath, chest pain, fever, cough at this phone interview.

## 2021-04-12 ENCOUNTER — Encounter (HOSPITAL_COMMUNITY)
Admission: RE | Admit: 2021-04-12 | Discharge: 2021-04-12 | Disposition: A | Discharge status: Home / Self Care | Payer: Medicare HMO | Source: Ambulatory Visit | Attending: Urology | Admitting: Urology

## 2021-04-12 DIAGNOSIS — Z01812 Encounter for preprocedural laboratory examination: Secondary | ICD-10-CM | POA: Diagnosis not present

## 2021-04-12 LAB — COMPREHENSIVE METABOLIC PANEL
ALT: 36 U/L (ref 0–44)
AST: 31 U/L (ref 15–41)
Albumin: 4.1 g/dL (ref 3.5–5.0)
Alkaline Phosphatase: 42 U/L (ref 38–126)
Anion gap: 5 (ref 5–15)
BUN: 17 mg/dL (ref 8–23)
CO2: 27 mmol/L (ref 22–32)
Calcium: 8.9 mg/dL (ref 8.9–10.3)
Chloride: 108 mmol/L (ref 98–111)
Creatinine, Ser: 1.14 mg/dL (ref 0.61–1.24)
GFR, Estimated: >60 mL/min (ref >60)
Glucose, Bld: 106 mg/dL — ABNORMAL HIGH (ref 70–99)
Potassium: 4 mmol/L (ref 3.5–5.1)
Sodium: 140 mmol/L (ref 135–145)
Total Bilirubin: 0.6 mg/dL (ref 0.3–1.2)
Total Protein: 7.1 g/dL (ref 6.5–8.1)

## 2021-04-12 LAB — CBC
HCT: 46.5 % (ref 39.0–52.0)
Hemoglobin: 15.1 g/dL (ref 13.0–17.0)
MCH: 30.9 pg (ref 26.0–34.0)
MCHC: 32.5 g/dL (ref 30.0–36.0)
MCV: 95.3 fL (ref 80.0–100.0)
Platelets: 177 10*3/uL (ref 150–400)
RBC: 4.88 MIL/uL (ref 4.22–5.81)
RDW: 13.9 % (ref 11.5–15.5)
WBC: 5.5 10*3/uL (ref 4.0–10.5)
nRBC: 0 % (ref 0.0–0.2)

## 2021-04-12 LAB — PROTIME-INR
INR: 1.1 (ref 0.8–1.2)
Prothrombin Time: 14 seconds (ref 11.4–15.2)

## 2021-04-12 LAB — APTT: aPTT: 29 seconds (ref 24–36)

## 2021-04-14 ENCOUNTER — Telehealth: Payer: Self-pay | Admitting: *Deleted

## 2021-04-14 NOTE — Telephone Encounter (Signed)
CALLED PATIENT TO REMIND OF PROCEDURE FOR 04-15-21, SPOKE WITH PATIENT AND HE IS AWARE OF THIS PROCEDURE

## 2021-04-15 ENCOUNTER — Ambulatory Visit (HOSPITAL_COMMUNITY): Payer: Medicare HMO

## 2021-04-15 ENCOUNTER — Encounter (HOSPITAL_BASED_OUTPATIENT_CLINIC_OR_DEPARTMENT_OTHER): Payer: Self-pay | Admitting: Urology

## 2021-04-15 ENCOUNTER — Encounter (HOSPITAL_BASED_OUTPATIENT_CLINIC_OR_DEPARTMENT_OTHER)
Admission: RE | Disposition: A | Discharge status: Home / Self Care | Payer: Self-pay | Source: Ambulatory Visit | Attending: Urology

## 2021-04-15 ENCOUNTER — Ambulatory Visit (HOSPITAL_BASED_OUTPATIENT_CLINIC_OR_DEPARTMENT_OTHER): Payer: Medicare HMO | Admitting: Anesthesiology

## 2021-04-15 ENCOUNTER — Other Ambulatory Visit: Payer: Self-pay

## 2021-04-15 ENCOUNTER — Ambulatory Visit (HOSPITAL_BASED_OUTPATIENT_CLINIC_OR_DEPARTMENT_OTHER)
Admission: RE | Admit: 2021-04-15 | Discharge: 2021-04-15 | Disposition: A | Discharge status: Home / Self Care | Payer: Medicare HMO | Source: Ambulatory Visit | Attending: Urology | Admitting: Urology

## 2021-04-15 DIAGNOSIS — C61 Malignant neoplasm of prostate: Secondary | ICD-10-CM | POA: Insufficient documentation

## 2021-04-15 HISTORY — PX: SPACE OAR INSTILLATION: SHX6769

## 2021-04-15 HISTORY — PX: RADIOACTIVE SEED IMPLANT: SHX5150

## 2021-04-15 SURGERY — INSERTION, RADIATION SOURCE, PROSTATE
Anesthesia: General | Site: Prostate

## 2021-04-15 MED ORDER — PROMETHAZINE HCL 25 MG/ML IJ SOLN: 6.2500 mg | INTRAMUSCULAR | Status: DC | PRN

## 2021-04-15 MED ORDER — DEXAMETHASONE SODIUM PHOSPHATE 10 MG/ML IJ SOLN
INTRAMUSCULAR | Status: DC | PRN
  Administered 2021-04-15: 10 mg via INTRAVENOUS

## 2021-04-15 MED ORDER — CIPROFLOXACIN IN D5W 400 MG/200ML IV SOLN
INTRAVENOUS | Status: AC
  Filled 2021-04-15: qty 200

## 2021-04-15 MED ORDER — FLEET ENEMA 7-19 GM/118ML RE ENEM: 1.0000 | ENEMA | Freq: Once | RECTAL | Status: DC

## 2021-04-15 MED ORDER — PROPOFOL 10 MG/ML IV BOLUS
INTRAVENOUS | Status: DC | PRN
  Administered 2021-04-15: 150 mg via INTRAVENOUS

## 2021-04-15 MED ORDER — PROPOFOL 10 MG/ML IV BOLUS
INTRAVENOUS | Status: AC
  Filled 2021-04-15: qty 20

## 2021-04-15 MED ORDER — OXYBUTYNIN CHLORIDE 5 MG PO TABS: 5.0000 mg | ORAL_TABLET | Freq: Three times a day (TID) | ORAL | 1 refills | Status: DC | PRN

## 2021-04-15 MED ORDER — OXYCODONE HCL 5 MG/5ML PO SOLN: 5.0000 mg | Freq: Once | ORAL | Status: DC | PRN

## 2021-04-15 MED ORDER — ACETAMINOPHEN 10 MG/ML IV SOLN: 1000.0000 mg | Freq: Once | INTRAVENOUS | Status: DC | PRN

## 2021-04-15 MED ORDER — ACETAMINOPHEN 160 MG/5ML PO SOLN: 325.0000 mg | ORAL | Status: DC | PRN

## 2021-04-15 MED ORDER — FENTANYL CITRATE (PF) 100 MCG/2ML IJ SOLN
INTRAMUSCULAR | Status: DC | PRN
  Administered 2021-04-15 (×3): 50 ug via INTRAVENOUS

## 2021-04-15 MED ORDER — MIDAZOLAM HCL 5 MG/5ML IJ SOLN
INTRAMUSCULAR | Status: DC | PRN
  Administered 2021-04-15: 2 mg via INTRAVENOUS

## 2021-04-15 MED ORDER — SODIUM CHLORIDE (PF) 0.9 % IJ SOLN
INTRAMUSCULAR | Status: DC | PRN
  Administered 2021-04-15: 3 mL via INTRAVENOUS

## 2021-04-15 MED ORDER — AMISULPRIDE (ANTIEMETIC) 5 MG/2ML IV SOLN: 10.0000 mg | Freq: Once | INTRAVENOUS | Status: DC | PRN

## 2021-04-15 MED ORDER — ONDANSETRON HCL 4 MG/2ML IJ SOLN
INTRAMUSCULAR | Status: DC | PRN
  Administered 2021-04-15: 4 mg via INTRAVENOUS

## 2021-04-15 MED ORDER — LIDOCAINE HCL (PF) 2 % IJ SOLN
INTRAMUSCULAR | Status: AC
  Filled 2021-04-15: qty 5

## 2021-04-15 MED ORDER — OXYCODONE HCL 5 MG PO TABS
5.0000 mg | ORAL_TABLET | Freq: Once | ORAL | Status: DC | PRN
Start: 2021-04-15 — End: 2021-04-15

## 2021-04-15 MED ORDER — PHENAZOPYRIDINE HCL 200 MG PO TABS: 200.0000 mg | ORAL_TABLET | Freq: Three times a day (TID) | ORAL | 0 refills | Status: DC | PRN

## 2021-04-15 MED ORDER — IOHEXOL 300 MG/ML  SOLN
INTRAMUSCULAR | Status: DC | PRN
  Administered 2021-04-15: 7 mL

## 2021-04-15 MED ORDER — ONDANSETRON HCL 4 MG/2ML IJ SOLN
INTRAMUSCULAR | Status: AC
  Filled 2021-04-15: qty 2

## 2021-04-15 MED ORDER — DEXAMETHASONE SODIUM PHOSPHATE 10 MG/ML IJ SOLN
INTRAMUSCULAR | Status: AC
  Filled 2021-04-15: qty 1

## 2021-04-15 MED ORDER — CIPROFLOXACIN IN D5W 400 MG/200ML IV SOLN
400.0000 mg | INTRAVENOUS | Status: AC
  Administered 2021-04-15: 400 mg via INTRAVENOUS

## 2021-04-15 MED ORDER — FENTANYL CITRATE (PF) 100 MCG/2ML IJ SOLN
INTRAMUSCULAR | Status: AC
  Filled 2021-04-15: qty 2

## 2021-04-15 MED ORDER — ARTIFICIAL TEARS OPHTHALMIC OINT
TOPICAL_OINTMENT | OPHTHALMIC | Status: AC
  Filled 2021-04-15: qty 3.5

## 2021-04-15 MED ORDER — FENTANYL CITRATE (PF) 100 MCG/2ML IJ SOLN: 25.0000 ug | INTRAMUSCULAR | Status: DC | PRN

## 2021-04-15 MED ORDER — CEPHALEXIN 500 MG PO CAPS: 500.0000 mg | ORAL_CAPSULE | Freq: Two times a day (BID) | ORAL | 0 refills | Status: AC

## 2021-04-15 MED ORDER — SODIUM CHLORIDE 0.9 % IV SOLN
INTRAVENOUS | Status: AC | PRN
  Administered 2021-04-15: 1000 mL via INTRAMUSCULAR

## 2021-04-15 MED ORDER — MIDAZOLAM HCL 2 MG/2ML IJ SOLN
INTRAMUSCULAR | Status: AC
  Filled 2021-04-15: qty 2

## 2021-04-15 MED ORDER — LIDOCAINE 2% (20 MG/ML) 5 ML SYRINGE
INTRAMUSCULAR | Status: DC | PRN
  Administered 2021-04-15: 40 mg via INTRAVENOUS

## 2021-04-15 MED ORDER — TRAMADOL HCL 50 MG PO TABS: 50.0000 mg | ORAL_TABLET | Freq: Four times a day (QID) | ORAL | 0 refills | Status: AC | PRN

## 2021-04-15 MED ORDER — STERILE WATER FOR IRRIGATION IR SOLN
Status: DC | PRN
  Administered 2021-04-15: 500 mL

## 2021-04-15 MED ORDER — ACETAMINOPHEN 325 MG PO TABS: 325.0000 mg | ORAL_TABLET | ORAL | Status: DC | PRN

## 2021-04-15 MED ORDER — LACTATED RINGERS IV SOLN: INTRAVENOUS | Status: DC

## 2021-04-15 SURGICAL SUPPLY — 38 items
BAG DRN RND TRDRP ANRFLXCHMBR (UROLOGICAL SUPPLIES) ×2
BAG URINE DRAIN 2000ML AR STRL (UROLOGICAL SUPPLIES) ×3 IMPLANT
BLADE CLIPPER SENSICLIP SURGIC (BLADE) ×3 IMPLANT
CATH FOLEY 2WAY SLVR  5CC 16FR (CATHETERS) ×3
CATH FOLEY 2WAY SLVR 5CC 16FR (CATHETERS) ×2 IMPLANT
CATH ROBINSON RED A/P 16FR (CATHETERS) IMPLANT
CATH ROBINSON RED A/P 20FR (CATHETERS) ×3 IMPLANT
CLOTH BEACON ORANGE TIMEOUT ST (SAFETY) ×3 IMPLANT
CNTNR URN SCR LID CUP LEK RST (MISCELLANEOUS) ×4 IMPLANT
CONT SPEC 4OZ STRL OR WHT (MISCELLANEOUS) ×6
COVER BACK TABLE 60X90IN (DRAPES) ×3 IMPLANT
COVER MAYO STAND STRL (DRAPES) ×3 IMPLANT
DRAPE C-ARM 35X43 STRL (DRAPES) ×3 IMPLANT
DRSG TEGADERM 4X4.75 (GAUZE/BANDAGES/DRESSINGS) ×3 IMPLANT
DRSG TEGADERM 8X12 (GAUZE/BANDAGES/DRESSINGS) ×6 IMPLANT
GAUZE SPONGE 4X4 12PLY STRL LF (GAUZE/BANDAGES/DRESSINGS) ×3 IMPLANT
GLOVE SURG ENC MOIS LTX SZ6.5 (GLOVE) ×3 IMPLANT
GLOVE SURG ENC MOIS LTX SZ7.5 (GLOVE) ×3 IMPLANT
GLOVE SURG ENC MOIS LTX SZ8 (GLOVE) IMPLANT
GLOVE SURG ORTHO LTX SZ8.5 (GLOVE) ×3 IMPLANT
GLOVE SURG UNDER POLY LF SZ6.5 (GLOVE) IMPLANT
GOWN STRL REUS W/TWL LRG LVL3 (GOWN DISPOSABLE) ×3 IMPLANT
GOWN STRL REUS W/TWL XL LVL3 (GOWN DISPOSABLE) ×3 IMPLANT
HOLDER FOLEY CATH W/STRAP (MISCELLANEOUS) IMPLANT
I-Seeed AgX100 ×204 IMPLANT
IMPL SPACEOAR VUE SYSTEM (Spacer) ×2 IMPLANT
IMPLANT SPACEOAR VUE SYSTEM (Spacer) ×3 IMPLANT
IV NS 1000ML (IV SOLUTION) ×3
IV NS 1000ML BAXH (IV SOLUTION) ×2 IMPLANT
KIT TURNOVER CYSTO (KITS) ×3 IMPLANT
MARKER SKIN DUAL TIP RULER LAB (MISCELLANEOUS) ×3 IMPLANT
PACK CYSTO (CUSTOM PROCEDURE TRAY) ×3 IMPLANT
SURGILUBE 2OZ TUBE FLIPTOP (MISCELLANEOUS) ×3 IMPLANT
SUT BONE WAX W31G (SUTURE) IMPLANT
SYR 10ML LL (SYRINGE) ×3 IMPLANT
TOWEL OR 17X26 10 PK STRL BLUE (TOWEL DISPOSABLE) ×3 IMPLANT
UNDERPAD 30X36 HEAVY ABSORB (UNDERPADS AND DIAPERS) ×6 IMPLANT
WATER STERILE IRR 500ML POUR (IV SOLUTION) ×3 IMPLANT

## 2021-04-15 NOTE — Anesthesia Procedure Notes (Signed)
Procedure Name: LMA Insertion Date/Time: 04/15/2021 12:23 PM Performed by: Rogers Blocker, CRNA Pre-anesthesia Checklist: Patient identified, Emergency Drugs available, Suction available and Patient being monitored Patient Re-evaluated:Patient Re-evaluated prior to induction Oxygen Delivery Method: Circle System Utilized Preoxygenation: Pre-oxygenation with 100% oxygen Induction Type: IV induction Ventilation: Mask ventilation without difficulty LMA: LMA inserted LMA Size: 4.0 Number of attempts: 1 Placement Confirmation: positive ETCO2 Tube secured with: Tape Dental Injury: Teeth and Oropharynx as per pre-operative assessment

## 2021-04-15 NOTE — Transfer of Care (Signed)
Immediate Anesthesia Transfer of Care Note  Patient: Roger Burgess  Procedure(s) Performed: RADIOACTIVE SEED IMPLANT/BRACHYTHERAPY IMPLANT WITH CYSTOSCOPY (Prostate) SPACE OAR INSTILLATION (Perineum)  Patient Location: PACU  Anesthesia Type:General  Level of Consciousness: drowsy, patient cooperative and responds to stimulation  Airway & Oxygen Therapy: Patient Spontanous Breathing and Patient connected to face mask oxygen  Post-op Assessment: Report given to RN and Post -op Vital signs reviewed and stable  Post vital signs: Reviewed and stable  Last Vitals:  Vitals Value Taken Time  BP 110/66 04/15/21 1332  Temp    Pulse 62 04/15/21 1335  Resp 16 04/15/21 1335  SpO2 93 % 04/15/21 1335  Vitals shown include unvalidated device data.  Last Pain:  Vitals:   04/15/21 1022  TempSrc:   PainSc: 0-No pain      Patients Stated Pain Goal: 4 ([id number])  Complications: No notable events documented.

## 2021-04-15 NOTE — Progress Notes (Signed)
  Radiation Oncology         [phone]) [phone] ________________________________  Name: Roger Burgess MRN: [id number]  Date: 04/15/2021  DOB: 1956-08-29       Prostate Seed Implant  [id number], Malva Limes., FNP  No ref. provider found  DIAGNOSIS:  65 y.o. gentleman with stage T1c adenocarcinoma of the prostate with a Gleason's score of 3+4 and a PSA of 14.3  PROCEDURE: Insertion of radioactive I-125 seeds into the prostate gland.  RADIATION DOSE: 145 Gy, definitive therapy.  TECHNIQUE: Roger Burgess was brought to the operating room with the urologist. He was placed in the dorsolithotomy position. He was catheterized and a rectal tube was inserted. The perineum was shaved, prepped and draped. The ultrasound probe was then introduced into the rectum to see the prostate gland.  TREATMENT DEVICE: A needle grid was attached to the ultrasound probe stand and anchor needles were placed.  3D PLANNING: The prostate was imaged in 3D using a sagittal sweep of the prostate probe. These images were transferred to the planning computer. There, the prostate, urethra and rectum were defined on each axial reconstructed image. Then, the software created an optimized 3D plan and a few seed positions were adjusted. The quality of the plan was reviewed using [facility] information for the target and the following two organs at risk:  Urethra and Rectum.  Then the accepted plan was printed and handed off to the radiation therapist.  Under my supervision, the custom loading of the seeds and spacers was carried out and loaded into sealed vicryl sleeves.  These pre-loaded needles were then placed into the needle holder.Marland Kitchen  PROSTATE VOLUME STUDY:  Using transrectal ultrasound the volume of the prostate was verified to be 58.7 cc.  SPECIAL TREATMENT PROCEDURE/SUPERVISION AND HANDLING: The pre-loaded needles were then delivered under sagittal guidance. A total of 15 needles were used to deposit 68 seeds in the prostate  gland. The individual seed activity was 0.617 mCi.  SpaceOAR:  Yes  COMPLEX SIMULATION: At the end of the procedure, an anterior radiograph of the pelvis was obtained to document seed positioning and count. Cystoscopy was performed to check the urethra and bladder.  MICRODOSIMETRY: At the end of the procedure, the patient was emitting 0.142 mR/hr at 1 meter. Accordingly, he was considered safe for hospital discharge.  PLAN: The patient will return to the radiation oncology clinic for post implant CT dosimetry in three weeks.   ________________________________  Sheral Apley Tammi Klippel, M.D.

## 2021-04-15 NOTE — H&P (Signed)
Urology Preoperative H&P   Chief Complaint:  Prostate cancer   History of Present Illness: Roger Burgess is a 65 y.o. male with T1c, Grade 2 prostate cancer.   Last PSA: 14.33 (07/28/2020), 11.84 (06/28/2020). No personal/family history of prostate cancer. No prior PNBx.  Biopsy Date: 12/06/20  TNM stage: T1c  Gleason score: Gleason 3+4  Left: G1 in one core  Right: G1 and G2 in one core each  Prostate volume: 58.45 cm^3   The patient is here today for brachytherapy seed placement as primary treatment of his prostate cancer.  He denies interval UTIs, dysuria or hematuria   Past Medical History:  Diagnosis Date   Prostate cancer [facility])    Wears dentures 04/11/2021    Past Surgical History:  Procedure Laterality Date   PROSTATE BIOPSY      Allergies: No Known Allergies  Family History  Problem Relation Age of Onset   Prostate cancer Maternal Grandfather    Breast cancer Neg Hx    Colon cancer Neg Hx    Pancreatic cancer Neg Hx     Social History:  reports that he has never smoked. He has never used smokeless tobacco. He reports that he does not drink alcohol and does not use drugs.  ROS: A complete review of systems was performed.  All systems are negative except for pertinent findings as noted.  Physical Exam:  Vital signs in last 24 hours: Temp:  [98.7 F (37.1 C)] 98.7 F (37.1 C) (07/22 1016) Pulse Rate:  [65] 65 (07/22 1016) Resp:  [20] 20 (07/22 1016) BP: (159)/(91) 159/91 (07/22 1016) SpO2:  [96 %] 96 % (07/22 1016) Weight:  [104.1 kg] 104.1 kg (07/22 1022) Constitutional:  Alert and oriented, No acute distress Cardiovascular: Regular rate and rhythm, No JVD Respiratory: Normal respiratory effort, Lungs clear bilaterally GI: Abdomen is soft, nontender, nondistended, no abdominal masses GU: No CVA tenderness Lymphatic: No lymphadenopathy Neurologic: Grossly intact, no focal deficits Psychiatric: Normal mood and affect  Laboratory Data:  No results  for input(s): WBC, HGB, HCT, PLT in the last 72 hours.  No results for input(s): NA, K, CL, GLUCOSE, BUN, CALCIUM, CREATININE in the last 72 hours.  Invalid input(s): CO3   No results found for this or any previous visit (from the past 24 hour(s)). No results found for this or any previous visit (from the past 240 hour(s)).  Renal Function: Recent Labs    04/12/21 0951  CREATININE 1.14   Estimated Creatinine Clearance: 78.3 mL/min (by C-G formula based on SCr of 1.14 mg/dL).  Radiologic Imaging: No results found.  I independently reviewed the above imaging studies.  Assessment and Plan ACKEEM Burgess is a 65 y.o. male with grade 2 prostate cancer  -The patient was counseled about the natural history of prostate cancer and the standard treatment options that are available for prostate cancer. It was explained to him how his age and life expectancy, clinical stage, Gleason score, and PSA affect his prognosis, the decision to proceed with additional staging studies, as well as how that information influences recommended treatment strategies. We discussed the roles for active surveillance, radiation therapy, surgical therapy, androgen deprivation, as well as ablative therapy options for the treatment of prostate cancer as appropriate to his individual cancer situation. We discussed the risks and benefits of these options with regard to their impact on cancer control and also in terms of potential adverse events, complications, and impact on quality of life particularly related to urinary and  sexual function. The patient was encouraged to ask questions throughout the discussion today and all questions were answered to his stated satisfaction. In addition, the patient was provided with and/or directed to appropriate resources and literature for further education about prostate cancer and treatment options.   The patient has decided to proceed with cystoscopy, brachytherapy seed and  SpaceOAR placement as primary treatment of his risk prostate cancer.  The risks, benefits and alternatives of the aforementioned procedures was discussed in detail.  Risks include, bur are not limited to worsening LUTS, erectile dysfunction, rectal irritation, urethral stricture formation, fistula formation, cancer recurrence, MI, CVA, PE, DVT and the inherent risk of general anesthesia.  He voices understanding and wishes to proceed.      Roger Hughs, MD 04/15/2021, 12:04 PM  [facility] Pager: [phone]

## 2021-04-15 NOTE — Anesthesia Preprocedure Evaluation (Addendum)
Anesthesia Evaluation  Patient identified by MRN, date of birth, ID band Patient awake    Reviewed: Allergy & Precautions, NPO status , Patient's Chart, lab work & pertinent test results  Airway Mallampati: I  TM Distance: >3 FB Neck ROM: Full    Dental  (+) Edentulous Upper, Edentulous Lower   Pulmonary neg pulmonary ROS,    breath sounds clear to auscultation       Cardiovascular negative cardio ROS   Rhythm:Regular Rate:Normal     Neuro/Psych negative neurological ROS  negative psych ROS   GI/Hepatic Neg liver ROS, GERD  ,  Endo/Other  negative endocrine ROS  Renal/GU negative Renal ROS     Musculoskeletal negative musculoskeletal ROS (+)   Abdominal Normal abdominal exam  (+)   Peds  Hematology negative hematology ROS (+)   Anesthesia Other Findings   Reproductive/Obstetrics                            Anesthesia Physical Anesthesia Plan  ASA: 2  Anesthesia Plan: General   Post-op Pain Management:    Induction: Intravenous  PONV Risk Score and Plan: 3 and Ondansetron, Midazolam and Dexamethasone  Airway Management Planned: LMA  Additional Equipment: None  Intra-op Plan:   Post-operative Plan: Extubation in OR  Informed Consent: I have reviewed the patients History and Physical, chart, labs and discussed the procedure including the risks, benefits and alternatives for the proposed anesthesia with the patient or authorized representative who has indicated his/her understanding and acceptance.     Dental advisory given  Plan Discussed with: CRNA  Anesthesia Plan Comments:        Anesthesia Quick Evaluation

## 2021-04-15 NOTE — Discharge Instructions (Addendum)

## 2021-04-15 NOTE — Op Note (Signed)
PATIENT:  Quin Hoop  PRE-OPERATIVE DIAGNOSIS:  Adenocarcinoma of the prostate  POST-OPERATIVE DIAGNOSIS:  Same  PROCEDURE:  1. I-125 radioactive seed implantation 2. Cystoscopy  3. Placement of SpaceOAR  SURGEON:  Ellison Hughs, MD  Radiation oncologist: Tyler Pita, MD  ANESTHESIA:  General  EBL:  Minimal  DRAINS: None  INDICATION: Quin Hoop  Description of procedure: After informed consent the patient was brought to the major OR, placed on the table and administered general anesthesia. He was then moved to the modified lithotomy position with his perineum perpendicular to the floor. His perineum and genitalia were then sterilely prepped. An official timeout was then performed. A 16 French Foley catheter was then placed in the bladder and filled with dilute contrast, a rectal tube was placed in the rectum and the transrectal ultrasound probe was placed in the rectum and affixed to the stand. He was then sterilely draped.  Real time ultrasonography was used along with the seed planning software. This was used to develop the seed plan including the number of needles as well as number of seeds required for complete and adequate coverage. Real-time ultrasonography was then used along with the previously developed plan and the Nucletron device to implant a total of 68 seeds using 15 needles. This proceeded without difficulty or complication.   I then proceeded with placement of SpaceOAR by introducing a needle with the bevel angled inferiorly approximately 2 cm superior to the anus. This was angled downward and under direct ultrasound was placed within the space between the prostatic capsule and rectum. This was confirmed with a small amount of sterile saline injected and this was performed under direct ultrasound. I then attached the SpaceOAR to the needle and injected this in the space between the prostate and rectum with good placement noted.  A Foley catheter  was then removed as well as the transrectal ultrasound probe and rectal probe. Flexible cystoscopy was then performed using the 16 French flexible scope which revealed a normal urethra throughout its length down to the sphincter which appeared intact. The prostatic urethra revealed bilobar hypertrophy but no evidence of obstruction, seeds, spacers or lesions. The bladder was then entered and fully and systematically inspected. The ureteral orifices were noted to be of normal configuration and position. The mucosa revealed no evidence of tumors. There were also no stones identified within the bladder. I noted no seeds or spacers on the floor of the bladder and retroflexion of the scope revealed no seeds protruding from the base of the prostate.  The cystoscope was then removed and the patient was awakened and taken to recovery room in stable and satisfactory condition. He tolerated procedure well and there were no intraoperative complications.

## 2021-04-15 NOTE — Anesthesia Postprocedure Evaluation (Signed)
Anesthesia Post Note  Patient: Roger Burgess  Procedure(s) Performed: RADIOACTIVE SEED IMPLANT/BRACHYTHERAPY IMPLANT WITH CYSTOSCOPY (Prostate) SPACE OAR INSTILLATION (Perineum)     Patient location during evaluation: PACU Anesthesia Type: General Level of consciousness: awake and alert Pain management: pain level controlled Vital Signs Assessment: post-procedure vital signs reviewed and stable Respiratory status: spontaneous breathing, nonlabored ventilation, respiratory function stable and patient connected to nasal cannula oxygen Cardiovascular status: blood pressure returned to baseline and stable Postop Assessment: no apparent nausea or vomiting Anesthetic complications: no   No notable events documented.  Last Vitals:  Vitals:   04/15/21 1520 04/15/21 1523  BP:    Pulse: 73 73  Resp: 15 13  Temp:  36.5 C  SpO2: 97% 97%    Last Pain:  Vitals:   04/15/21 1523  TempSrc: Oral  PainSc:                  Roger Burgess

## 2021-04-18 ENCOUNTER — Encounter (HOSPITAL_BASED_OUTPATIENT_CLINIC_OR_DEPARTMENT_OTHER): Payer: Self-pay | Admitting: Urology

## 2021-05-02 ENCOUNTER — Telehealth: Payer: Self-pay | Admitting: *Deleted

## 2021-05-02 NOTE — Telephone Encounter (Signed)
Called patient to remind of post seed appts. for 05-03-21, spoke with patient and he is aware of these appts.

## 2021-05-03 ENCOUNTER — Other Ambulatory Visit: Payer: Self-pay

## 2021-05-03 ENCOUNTER — Ambulatory Visit
Admission: RE | Admit: 2021-05-03 | Discharge: 2021-05-03 | Disposition: A | Discharge status: Home / Self Care | Payer: Medicare HMO | Source: Ambulatory Visit | Attending: Radiation Oncology | Admitting: Radiation Oncology

## 2021-05-03 ENCOUNTER — Other Ambulatory Visit: Payer: Self-pay | Admitting: Urology

## 2021-05-03 ENCOUNTER — Encounter: Payer: Self-pay | Admitting: Urology

## 2021-05-03 ENCOUNTER — Ambulatory Visit
Admission: RE | Admit: 2021-05-03 | Discharge: 2021-05-03 | Disposition: A | Discharge status: Home / Self Care | Payer: Medicare HMO | Source: Ambulatory Visit | Attending: Urology | Admitting: Urology

## 2021-05-03 VITALS — BP 131/87 | HR 66 | Temp 96.9°F | Resp 18 | Ht 71.0 in | Wt 232.4 lb

## 2021-05-03 DIAGNOSIS — Z510 Encounter for antineoplastic radiation therapy: Secondary | ICD-10-CM | POA: Insufficient documentation

## 2021-05-03 DIAGNOSIS — C61 Malignant neoplasm of prostate: Secondary | ICD-10-CM | POA: Diagnosis not present

## 2021-05-03 NOTE — Progress Notes (Signed)
Radiation Oncology         [phone] ________________________________  Name: PAYNE TESKEY MRN: [id number]  Date: 05/03/2021  DOB: [birth year]  Post-Seed Follow-Up Visit Note  CC: Sonia Side., FNP  [facility]*  Diagnosis:   65 y.o. gentleman with stage T1c adenocarcinoma of the prostate with a Gleason's score of 3+4 and a PSA of 14.3    ICD-10-CM   1. Malignant neoplasm of prostate ([place])  C61       Interval Since Last Radiation:  2 weeks 04/15/21:  Insertion of radioactive I-125 seeds into the prostate gland; 145 Gy, definitive therapy with placement of SpaceOAR gel.  Narrative:  The patient returns today for routine follow-up.  He is complaining of increased urinary frequency and urinary hesitation symptoms. He filled out a questionnaire regarding urinary function today providing and overall IPSS score of 15 characterizing his symptoms as moderate with nocturia x3, frequency, urgency, intermittency and weak stream.  He is taking Oxybutynin as prescribed. He specifically denies dysuria, gross hematuria, straining to void or incontinence.  His pre-implant score was 13. He denies any abdominal pain or bowel symptoms. He reports a healthy appetite and is maintaining his weight. He has noted decreased stamina and associates this with not sleeping well at night. He feels that he is not getting deep, restful sleep, even though he feels tired when he goes to bed. He has trouble falling asleep and staying asleep.  ALLERGIES:  has No Known Allergies.  Meds: Current Outpatient Medications  Medication Sig Dispense Refill   ibuprofen (ADVIL,MOTRIN) 200 MG tablet Take 600 mg by mouth every 6 (six) hours as needed for fever or moderate pain.     meloxicam (MOBIC) 15 MG tablet daily.     oxybutynin (DITROPAN) 5 MG tablet Take 1 tablet (5 mg total) by mouth every 8 (eight) hours as needed for bladder spasms. 30 tablet 1   pantoprazole (PROTONIX) 40 MG tablet Take 40 mg by  mouth daily.     phenazopyridine (PYRIDIUM) 200 MG tablet Take 1 tablet (200 mg total) by mouth 3 (three) times daily as needed (for pain with urination). (Patient not taking: Reported on 05/03/2021) 30 tablet 0   No current facility-administered medications for this encounter.    Physical Findings: In general this is a well appearing African American male in no acute distress. He's alert and oriented x4 and appropriate throughout the examination. Cardiopulmonary assessment is negative for acute distress and he exhibits normal effort.   Lab Findings: Lab Results  Component Value Date   WBC 5.5 04/12/2021   HGB 15.1 04/12/2021   HCT 46.5 04/12/2021   MCV 95.3 04/12/2021   PLT 177 04/12/2021    Radiographic Findings:  Patient underwent CT imaging in our clinic for post implant dosimetry. The CT will be reviewed by Dr. Tammi Klippel to confirm there is an adequate distribution of radioactive seeds throughout the prostate gland and ensure that there are no seeds in or near the rectum. We suspect the final radiation plan and dosimetry will show appropriate coverage of the prostate gland. He understands that we will call and inform him of any unexpected findings on further review of his imaging and dosimetry.  Impression/Plan:  65 y.o. gentleman with stage T1c adenocarcinoma of the prostate with a Gleason's score of 3+4 and a PSA of 14.3. The patient is recovering from the effects of radiation. His urinary symptoms should gradually improve over the next 4-6 months. We talked about this  today. He is encouraged by his improvement already and is otherwise pleased with his outcome. We also talked about long-term follow-up for prostate cancer following seed implant. He understands that ongoing PSA determinations and digital rectal exams will help perform surveillance to rule out disease recurrence. He has a follow up appointment scheduled with Jiles Crocker, NP on 05/16/21 and will see Dr. Lovena Neighbours in October 2022  for his first post-treatment PSA. He understands what to expect with his PSA measures. Patient was also educated today about some of the long-term effects from radiation including a small risk for rectal bleeding and possibly erectile dysfunction. We talked about some of the general management approaches to these potential complications. However, I did encourage the patient to contact our office or return at any point if he has questions or concerns related to his previous radiation and prostate cancer.  2. Difficulty sleeping. He will try some Melatonin to see if this helps him get more restful sleep at night. If this is not helpful, he will discuss this further with his PCP.    Nicholos Johns, PA-C

## 2021-05-03 NOTE — Progress Notes (Signed)
Patient reports no issues emptying his bladder but is having some mild frequency. Patient also having some intermittency, urgency, and a weak urine stream, with nocturia about half the time with occasional straining. Patient follow up with urology on 05/16/21.  I-PSS score of 15.

## 2021-05-03 NOTE — Progress Notes (Signed)
  Radiation Oncology         [phone] ________________________________  Name: Roger Burgess MRN: [id number]  Date: 05/03/2021  DOB: May 31, 1956  COMPLEX SIMULATION NOTE  NARRATIVE:  The patient was brought to the Paint suite today following prostate seed implantation approximately one month ago.  Identity was confirmed.  All relevant records and images related to the planned course of therapy were reviewed.  Then, the patient was set-up supine.  CT images were obtained.  The CT images were loaded into the planning software.  Then the prostate and rectum were contoured.  Treatment planning then occurred.  The implanted iodine 125 seeds were identified by the physics staff for projection of radiation distribution  I have requested : 3D Simulation  I have requested a DVH of the following structures: Prostate and rectum.    ________________________________  Sheral Apley Tammi Klippel, M.D.

## 2021-05-09 ENCOUNTER — Encounter: Payer: Self-pay | Admitting: Radiation Oncology

## 2021-05-09 DIAGNOSIS — Z510 Encounter for antineoplastic radiation therapy: Secondary | ICD-10-CM | POA: Diagnosis not present

## 2021-05-16 NOTE — Progress Notes (Signed)
  Radiation Oncology         [phone] ________________________________  Name: Roger Burgess MRN: [id number]  Date: 05/09/2021  DOB: [birth year]  3D Planning Note   Prostate Brachytherapy Post-Implant Dosimetry  Diagnosis: 65 y.o. gentleman with stage T1c adenocarcinoma of the prostate with a Gleason's score of 3+4 and a PSA of 14.3  Narrative: On a previous date, Roger Burgess returned following prostate seed implantation for post implant planning. He underwent CT scan complex simulation to delineate the three-dimensional structures of the pelvis and demonstrate the radiation distribution.  Since that time, the seed localization, and complex isodose planning with dose volume histograms have now been completed.  Results:   Prostate Coverage - The dose of radiation delivered to the 90% or more of the prostate gland (D90) was 119.4% of the prescription dose. This exceeds our goal of greater than 90%. Rectal Sparing - The volume of rectal tissue receiving the prescription dose or higher was 0.0 cc. This falls under our thresholds tolerance of 1.0 cc.  Impression: The prostate seed implant appears to show adequate target coverage and appropriate rectal sparing.  Plan:  The patient will continue to follow with urology for ongoing PSA determinations. I would anticipate a high likelihood for local tumor control with minimal risk for rectal morbidity.  ________________________________  Sheral Apley Tammi Klippel, M.D.

## 2021-06-22 ENCOUNTER — Telehealth: Payer: Self-pay | Admitting: Adult Health

## 2021-06-22 NOTE — Telephone Encounter (Signed)
Gave patient education about SCP visit.  Patient would like to come for an in person visit on 07/04/2021 at [place].  Schedule message sent.   Wilber Bihari, NP

## 2021-07-04 ENCOUNTER — Inpatient Hospital Stay: Payer: Medicare HMO | Attending: Adult Health | Admitting: Adult Health

## 2021-07-04 ENCOUNTER — Other Ambulatory Visit: Payer: Self-pay

## 2021-07-04 ENCOUNTER — Encounter: Payer: Self-pay | Admitting: Adult Health

## 2021-07-04 VITALS — BP 156/73 | HR 58 | Temp 97.7°F | Resp 18 | Ht 71.0 in | Wt 235.4 lb

## 2021-07-04 DIAGNOSIS — R3911 Hesitancy of micturition: Secondary | ICD-10-CM | POA: Diagnosis not present

## 2021-07-04 DIAGNOSIS — Z1211 Encounter for screening for malignant neoplasm of colon: Secondary | ICD-10-CM

## 2021-07-04 DIAGNOSIS — C61 Malignant neoplasm of prostate: Secondary | ICD-10-CM | POA: Insufficient documentation

## 2021-07-04 NOTE — Progress Notes (Signed)
SURVIVORSHIP VISIT:   BRIEF ONCOLOGIC HISTORY:  Oncology History  Malignant neoplasm of prostate ([place])  12/06/2020 Cancer Staging   Staging form: Prostate, AJCC 8th Edition - Clinical stage from 12/06/2020: Stage IIB (cT1c, cN0, cM0, PSA: 14.3, Grade Group: 2) - Signed by Freeman Caldron, PA-C on 05/03/2021 Histopathologic type: Adenocarcinoma, NOS Stage prefix: Initial diagnosis Prostate specific antigen (PSA) range: 10 to 19 Gleason primary pattern: 3 Gleason secondary pattern: 4 Gleason score: 7 Histologic grading system: 5 grade system Number of biopsy cores examined: 12 Number of biopsy cores positive: 3 Location of positive needle core biopsies: Both sides   12/20/2020 Initial Diagnosis   Malignant neoplasm of prostate ([place])   04/15/2021 - 05/09/2021 Radiation Therapy   Prostate Brachytherapy Post-Implant Dosimetry   Diagnosis: 65 y.o. 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 He underwent CT scan complex simulation to delineate the three-dimensional structures of the pelvis and demonstrate the radiation distribution.  Since that time, the seed localization, and complex isodose planning with dose volume histograms have now been completed.   Results:   Prostate Coverage - The dose of radiation delivered to the 90% or more of the prostate gland (D90) was 119.4% of the prescription dose. This exceeds our goal of greater than 90%. Rectal Sparing - The volume of rectal tissue receiving the prescription dose or higher was 0.0 cc. This falls under our thresholds tolerance of 1.0 cc.   Impression: The prostate seed implant appears to show adequate target coverage and appropriate rectal sparing.   Plan:  The patient will continue to follow with urology for ongoing PSA determinations. I would anticipate a  high likelihood for local tumor control with minimal risk for rectal morbidity.     INTERVAL HISTORY:  Roger Burgess to review her survivorship care plan detailing her treatment course for breast cancer, as well as monitoring long-term side effects of that treatment, education regarding health maintenance, screening, and overall wellness and health promotion.     Overall, Roger Burgess reports feeling quite well.  He experiences some urinary stream issues with hesitancy and weakness of his stream.  He is unsure what to do about this to make this better.  He denies any other issues today.    REVIEW OF SYSTEMS:  Review of Systems  Constitutional:  Negative for appetite change, chills, fatigue, fever and unexpected weight change.  HENT:   Negative for hearing loss, lump/mass and trouble swallowing.   Eyes:  Negative for eye problems and icterus.  Respiratory:  Negative for chest tightness, cough and shortness of breath.   Cardiovascular:  Negative for chest pain, leg swelling and palpitations.  Gastrointestinal:  Negative for abdominal distention, abdominal pain, constipation, diarrhea, nausea and vomiting.  Endocrine: Negative for hot flashes.  Genitourinary:  Positive for bladder incontinence, difficulty urinating and frequency.   Musculoskeletal:  Negative for arthralgias.  Skin:  Negative for itching and rash.  Neurological:  Negative for dizziness, extremity weakness, headaches and numbness.  Hematological:  Negative for adenopathy. Does not bruise/bleed easily.  Psychiatric/Behavioral:  Negative for depression. The patient is not nervous/anxious.   Breast: Denies any new nodularity, masses, tenderness, nipple changes, or nipple discharge.      ONCOLOGY TREATMENT TEAM:  1. Urologist:  Dr. Lovena Neighbours at [facility] 2. Radiation Oncologist: Dr. Tammi Klippel    PAST MEDICAL/SURGICAL  HISTORY:  Past Medical History:  Diagnosis Date   Prostate cancer [facility])    Wears dentures 04/11/2021    Past Surgical History:  Procedure Laterality Date   PROSTATE BIOPSY     RADIOACTIVE SEED IMPLANT N/A 04/15/2021   Procedure: RADIOACTIVE SEED IMPLANT/BRACHYTHERAPY IMPLANT WITH CYSTOSCOPY;  Surgeon: Ceasar Mons, MD;  Location: [facility];  Service: Urology;  Laterality: N/A;   SPACE OAR INSTILLATION N/A 04/15/2021   Procedure: SPACE OAR INSTILLATION;  Surgeon: Ceasar Mons, MD;  [facility];  Service: Urology;  Laterality: N/A;     ALLERGIES:  No Known Allergies   CURRENT MEDICATIONS:  Outpatient Encounter Medications as of 07/04/2021  Medication Sig   ibuprofen (ADVIL,MOTRIN) 200 MG tablet Take 600 mg by mouth every 6 (six) hours as needed for fever or moderate pain.   meloxicam (MOBIC) 15 MG tablet daily. (Patient not taking: Reported on 07/04/2021)   pantoprazole (PROTONIX) 40 MG tablet Take 40 mg by mouth daily. (Patient not taking: Reported on 07/04/2021)   [DISCONTINUED] oxybutynin (DITROPAN) 5 MG tablet Take 1 tablet (5 mg total) by mouth every 8 (eight) hours as needed for bladder spasms. (Patient not taking: Reported on 07/04/2021)   [DISCONTINUED] phenazopyridine (PYRIDIUM) 200 MG tablet Take 1 tablet (200 mg total) by mouth 3 (three) times daily as needed (for pain with urination). (Patient not taking: No sig reported)   No facility-administered encounter medications on file as of 07/04/2021.     ONCOLOGIC FAMILY HISTORY:  Family History  Problem Relation Age of Onset   Prostate cancer Maternal Grandfather    Breast cancer Neg Hx    Colon cancer Neg Hx    Pancreatic cancer Neg Hx      GENETIC COUNSELING/TESTING: Not at this time  SOCIAL HISTORY:  Social History   Socioeconomic History   Marital status: Married    Spouse name: Not on file   Number of children: 2   Years of education: Not on file   Highest education level: Not on file  Occupational History    Comment: owner  Tobacco Use    Smoking status: Never   Smokeless tobacco: Never  Vaping Use   Vaping Use: Never used  Substance and Sexual Activity   Alcohol use: No   Drug use: Never   Sexual activity: Yes  Other Topics Concern   Not on file  Social History Narrative   Not on file   Social Determinants of Health   Financial Resource Strain: Low Risk    Difficulty of Paying Living Expenses: Not hard at all  Food Insecurity: No Food Insecurity   Worried About Charity fundraiser in the Last Year: Never true   Ran Out of Food in the Last Year: Never true  Transportation Needs: No Transportation Needs   Lack of Transportation (Medical): No   Lack of Transportation (Non-Medical): No  Physical Activity: Inactive   Days of Exercise per Week: 0 days   Minutes of Exercise per Session: 0 min  Stress: No Stress Concern Present   Feeling of Stress : Not at all  Social Connections: Moderately Integrated   Frequency of Communication with Friends and Family: More than three times a week   Frequency of Social Gatherings with Friends and Family: Three times a week   Attends Religious Services: 1 to 4 times per year   Active Member of Clubs or Organizations: No   Attends Archivist Meetings: Never   Marital  Status: Married  Human resources officer Violence: Not At Risk   Fear of Current or Ex-Partner: No   Emotionally Abused: No   Physically Abused: No   Sexually Abused: No     OBSERVATIONS/OBJECTIVE:  BP (!) 156/73 (BP Location: Left Arm, Patient Position: Sitting)   Pulse (!) 58   Temp 97.7 F (36.5 C) (Tympanic)   Resp 18   Ht 5\' 11"  (1.803 m)   Wt 235 lb 6.4 oz (106.8 kg)   SpO2 99%   BMI 32.83 kg/m  GENERAL: Patient is a well appearing male in no acute distress HEENT:  Sclerae anicteric.  Mask in place. Neck is supple.  NODES:  No cervical, supraclavicular, or axillary lymphadenopathy palpated.  LUNGS:  Clear to auscultation bilaterally.  No wheezes or rhonchi. HEART:  Regular rate and rhythm.  No murmur appreciated. ABDOMEN:  Soft, nontender.  Positive, normoactive bowel sounds. No organomegaly palpated. MSK:  No focal spinal tenderness to palpation. Full range of motion bilaterally in the upper extremities. EXTREMITIES:  No peripheral edema.   SKIN:  Clear with no obvious rashes or skin changes. No nail dyscrasia. NEURO:  Nonfocal. Well oriented.  Appropriate affect.   LABORATORY DATA:  None for this visit.  DIAGNOSTIC IMAGING:  None for this visit.      ASSESSMENT AND PLAN:  Mr.. Burgess is a pleasant 65 y.o. male with stage IIB favorable intermediate risk prostate.  She presents to the Survivorship Clinic for our initial meeting and routine follow-up post-completion of treatment for breast cancer.    1. Stage IIB prostate cancer:  Roger Burgess is continuing to recover from definitive treatment for prostate cancer.  He is due for follow-up and PSA testing with Dr. Lovena Neighbours next month. Today, a comprehensive survivorship care plan and treatment summary was reviewed with the patient today detailing his prostate cancer diagnosis, treatment course, potential late/long-term effects of treatment, appropriate follow-up care with recommendations for the future, and patient education resources.  A copy of this summary, along with a letter will be sent to the patient's primary care provider via mail/fax/In Basket message after today's visit.    2. Urinary hesitancy: He is still struggling with this, and wonders what he might take to help with his urinary stream.  I let him know that I defer to urology, but rather than wait for his upcoming appt with Dr. Lovena Neighbours, I suggested he go ahead and call to see if there is anything he could try in the interim prior to his appointment.    3. Bone health:  He was given education on specific activities to promote bone health.  4. Cancer screening:  Due to Roger Burgess history and her age, she should receive screening for skin cancers and colon  cancer.  The information and recommendations are listed on the patient's comprehensive care plan/treatment summary and were reviewed in detail with the patient.  He is 65 years old and has not yet undergone colon cancer screening.  He is hesitant to do this, but agreed for me to refer him to GI today.   5. Health maintenance and wellness promotion: Roger Burgess was encouraged to consume 5-7 servings of fruits and vegetables per day. He was also encouraged to engage in moderate to vigorous exercise for 30 minutes per day most days of the week. We discussed the [employer] fitness program, which is designed for cancer survivors to help them become more physically fit after cancer treatments.  He was instructed to limit her alcohol  consumption and continue to abstain from tobacco use.     6. Support services/counseling: It is not uncommon for this period of the patient's cancer care trajectory to be one of many emotions and stressors.  He was given information regarding our available services and encouraged to contact me with any questions or for help enrolling in any of our support group/programs.    Follow up instructions:    -Return to cancer center PRN  -Follow up with urology 07/2021 with PSA   The patient was provided an opportunity to ask questions and all were answered. The patient agreed with the plan and demonstrated an understanding of the instructions.   Total encounter time: 45 minutes in face to face visit time, chart review, lab review, order entry, and documentation of the encounter.    Roger Bihari, NP 07/04/21 10:41 AM Medical Oncology and Hematology [facility], [place] [address] Tel. [phone]    Fax. [phone]  *Total Encounter Time as defined by the Centers for Medicare and Medicaid Services includes, in addition to the face-to-face time of a patient visit (documented in the note above) non-face-to-face time: obtaining and  reviewing outside history, ordering and reviewing medications, tests or procedures, care coordination (communications with other health care professionals or caregivers) and documentation in the medical record.

## 2021-08-15 ENCOUNTER — Encounter: Payer: Self-pay | Admitting: Adult Health

## 2022-08-19 IMAGING — CR DG KNEE COMPLETE 4+V*L*
4 series · 4 of 4 positions shown · non-contrast
Comparison: None.

CLINICAL DATA: Left knee pain without trauma

EXAM:
LEFT KNEE - COMPLETE 4+ VIEW

[t knee ap left]
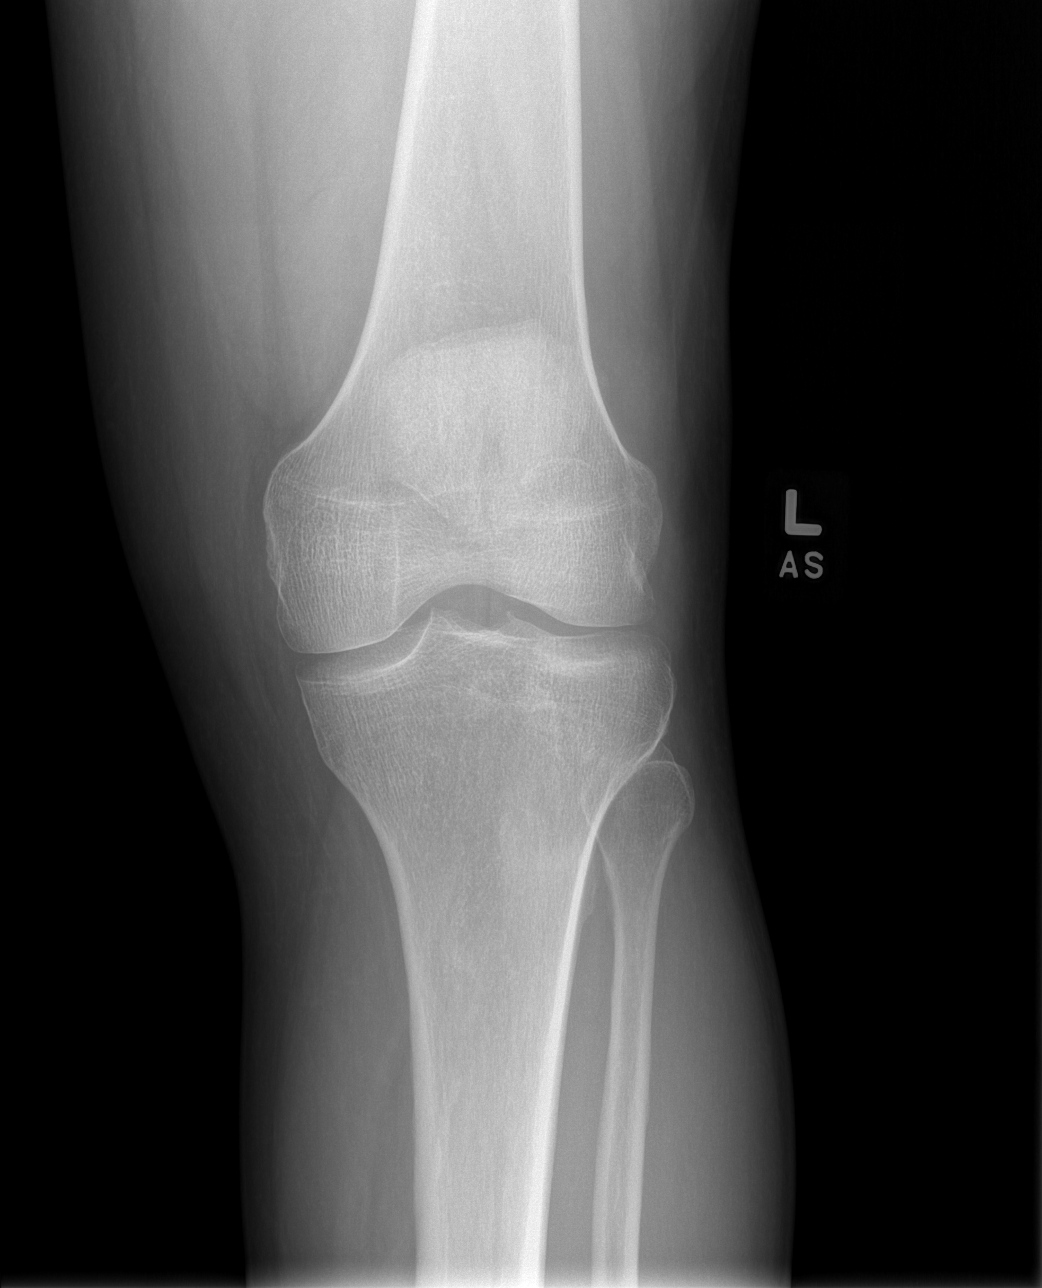

[t knee oblique left (1 of 2)]
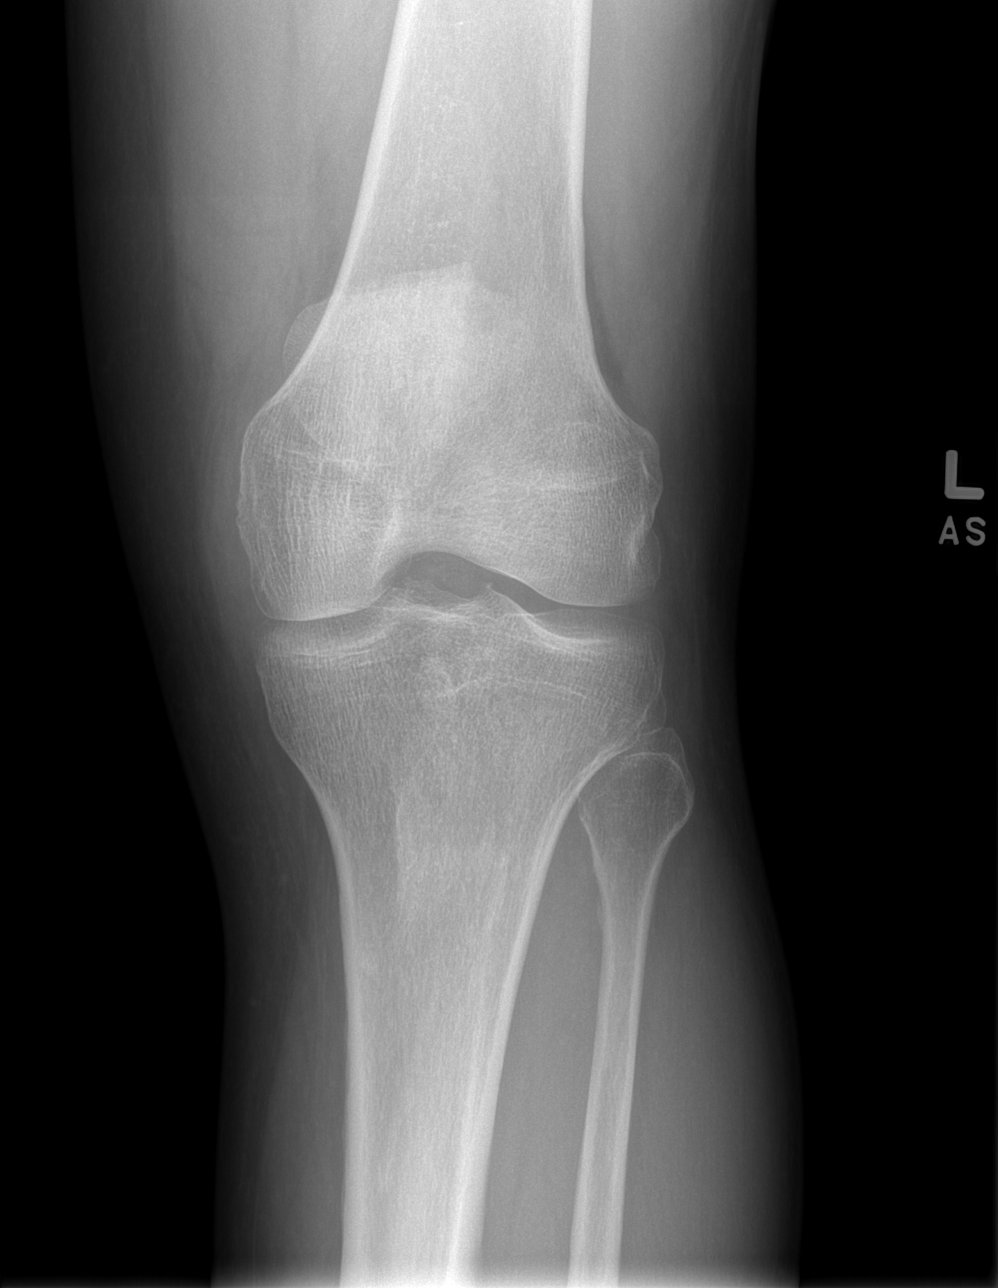

[t knee oblique left (2 of 2)]
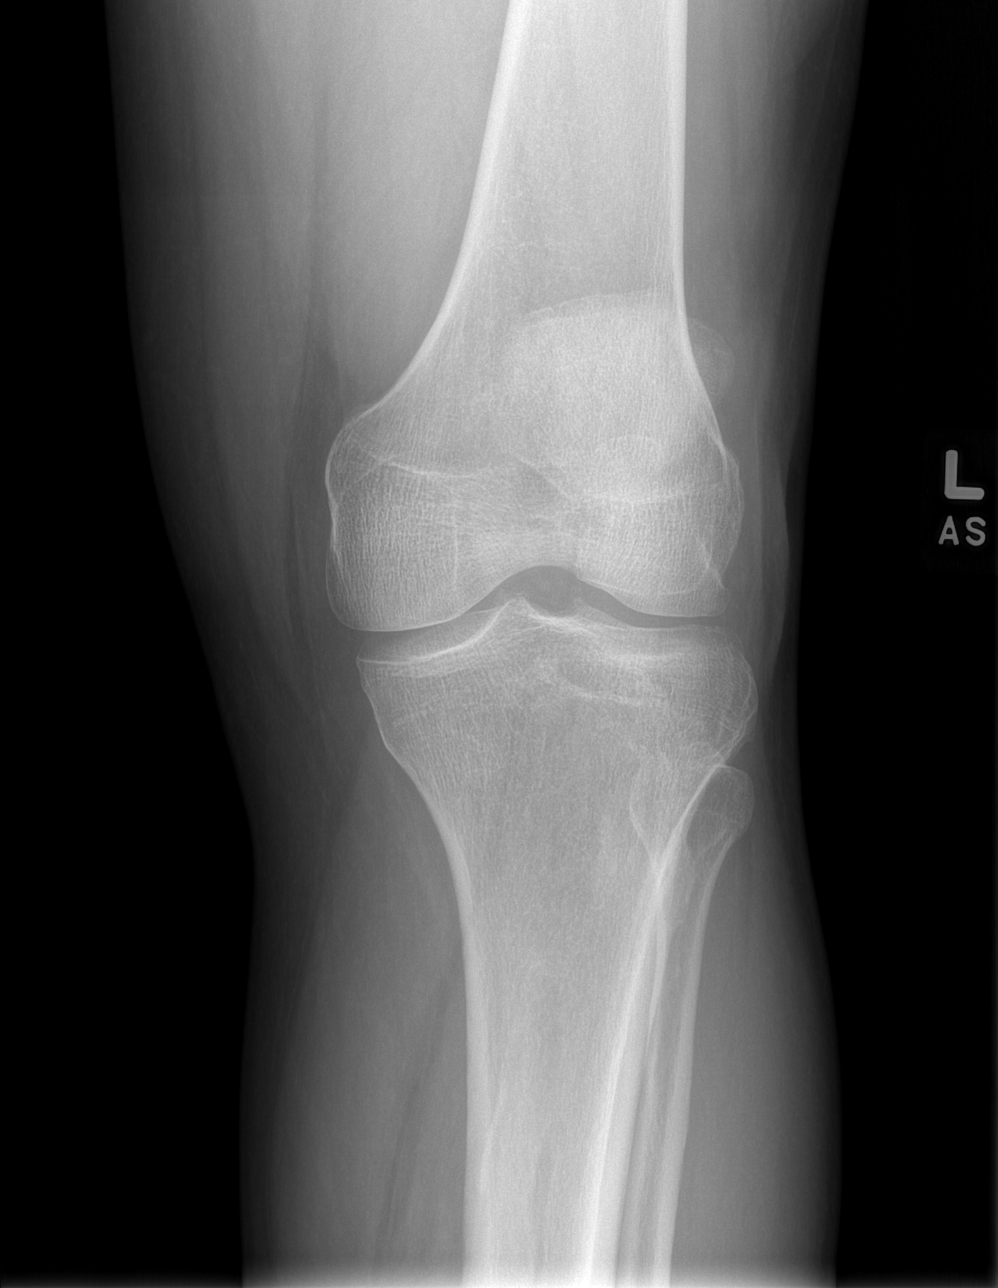

[t knee lat left]
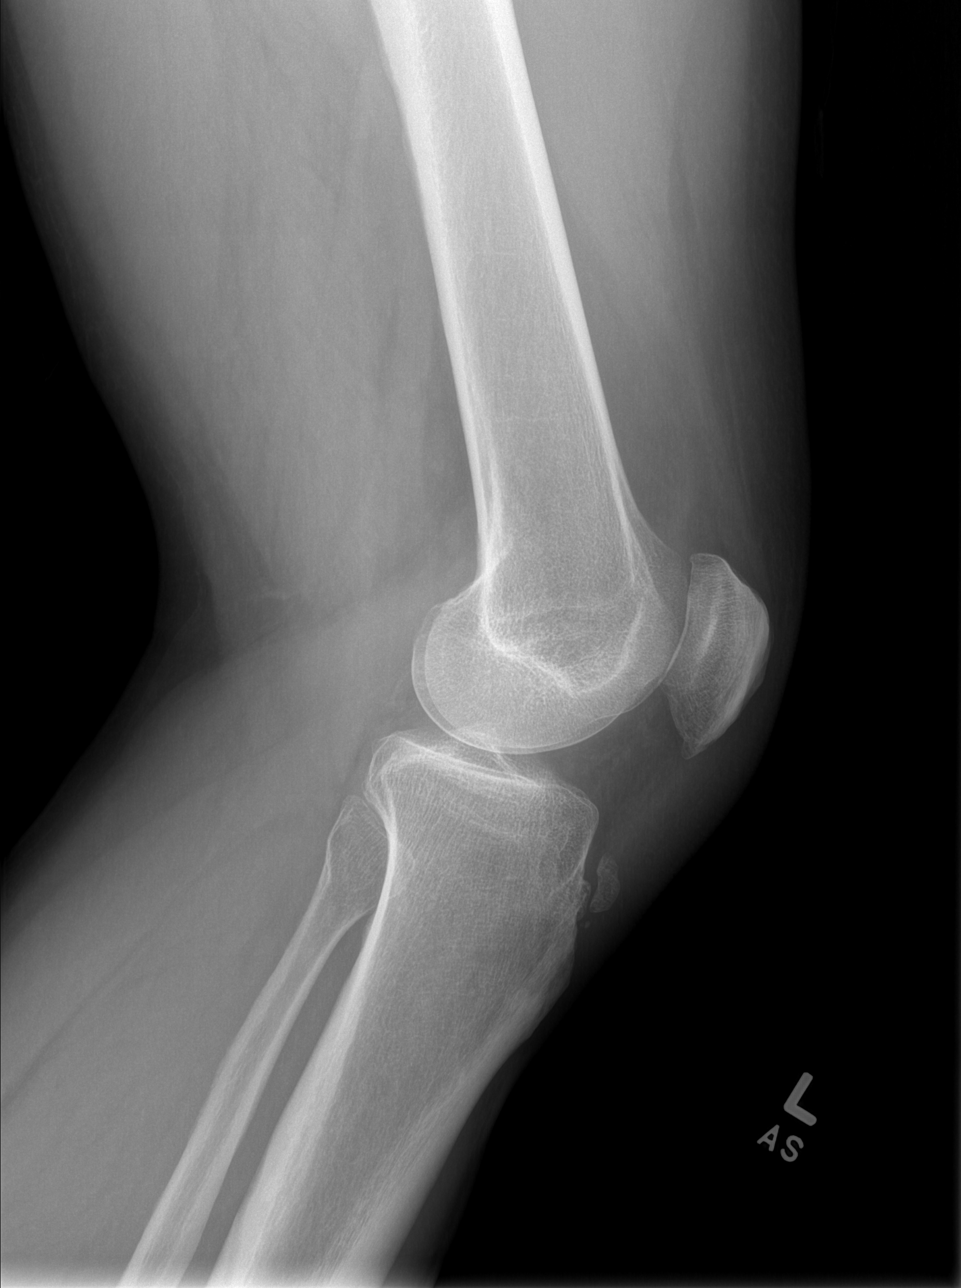

[4 of 4 positions shown; findings below may reference images not displayed]

FINDINGS: No acute fracture or dislocation. No joint effusion. Joint spaces
are maintained for age. Minimal joint space narrowing involving the
patellofemoral articulation. Osseous irregularity at the tibial
apophysis suggests remote Faxx Tiger disease.
IMPRESSION: No acute osseous abnormality.

## 2023-05-15 IMAGING — DX DG CHEST 2V
2 series · 2 of 2 positions shown · non-contrast
Comparison: December 23, 2015

CLINICAL DATA: Preoperative evaluation.

EXAM:
CHEST - 2 VIEW

[chest pa]
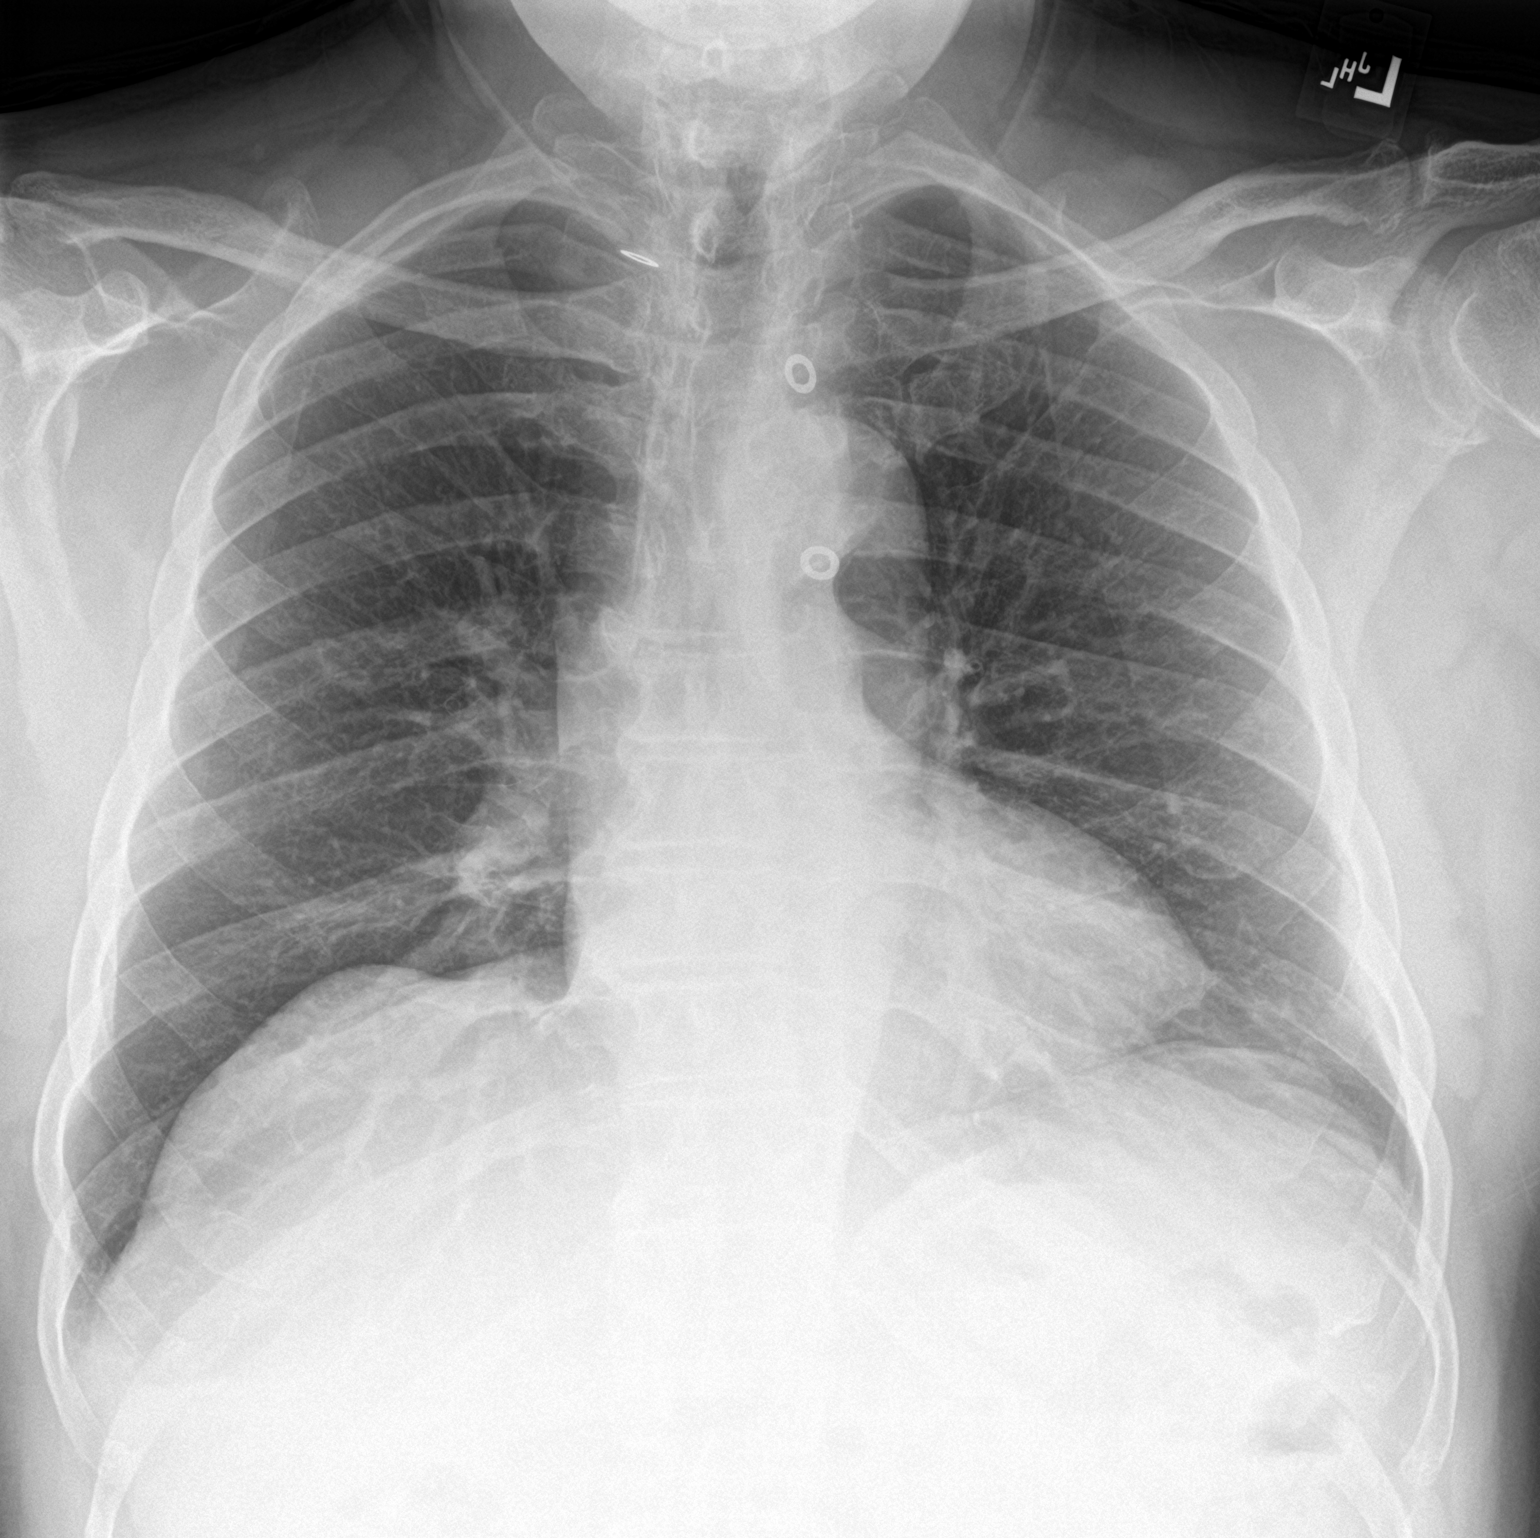

[chest lat]
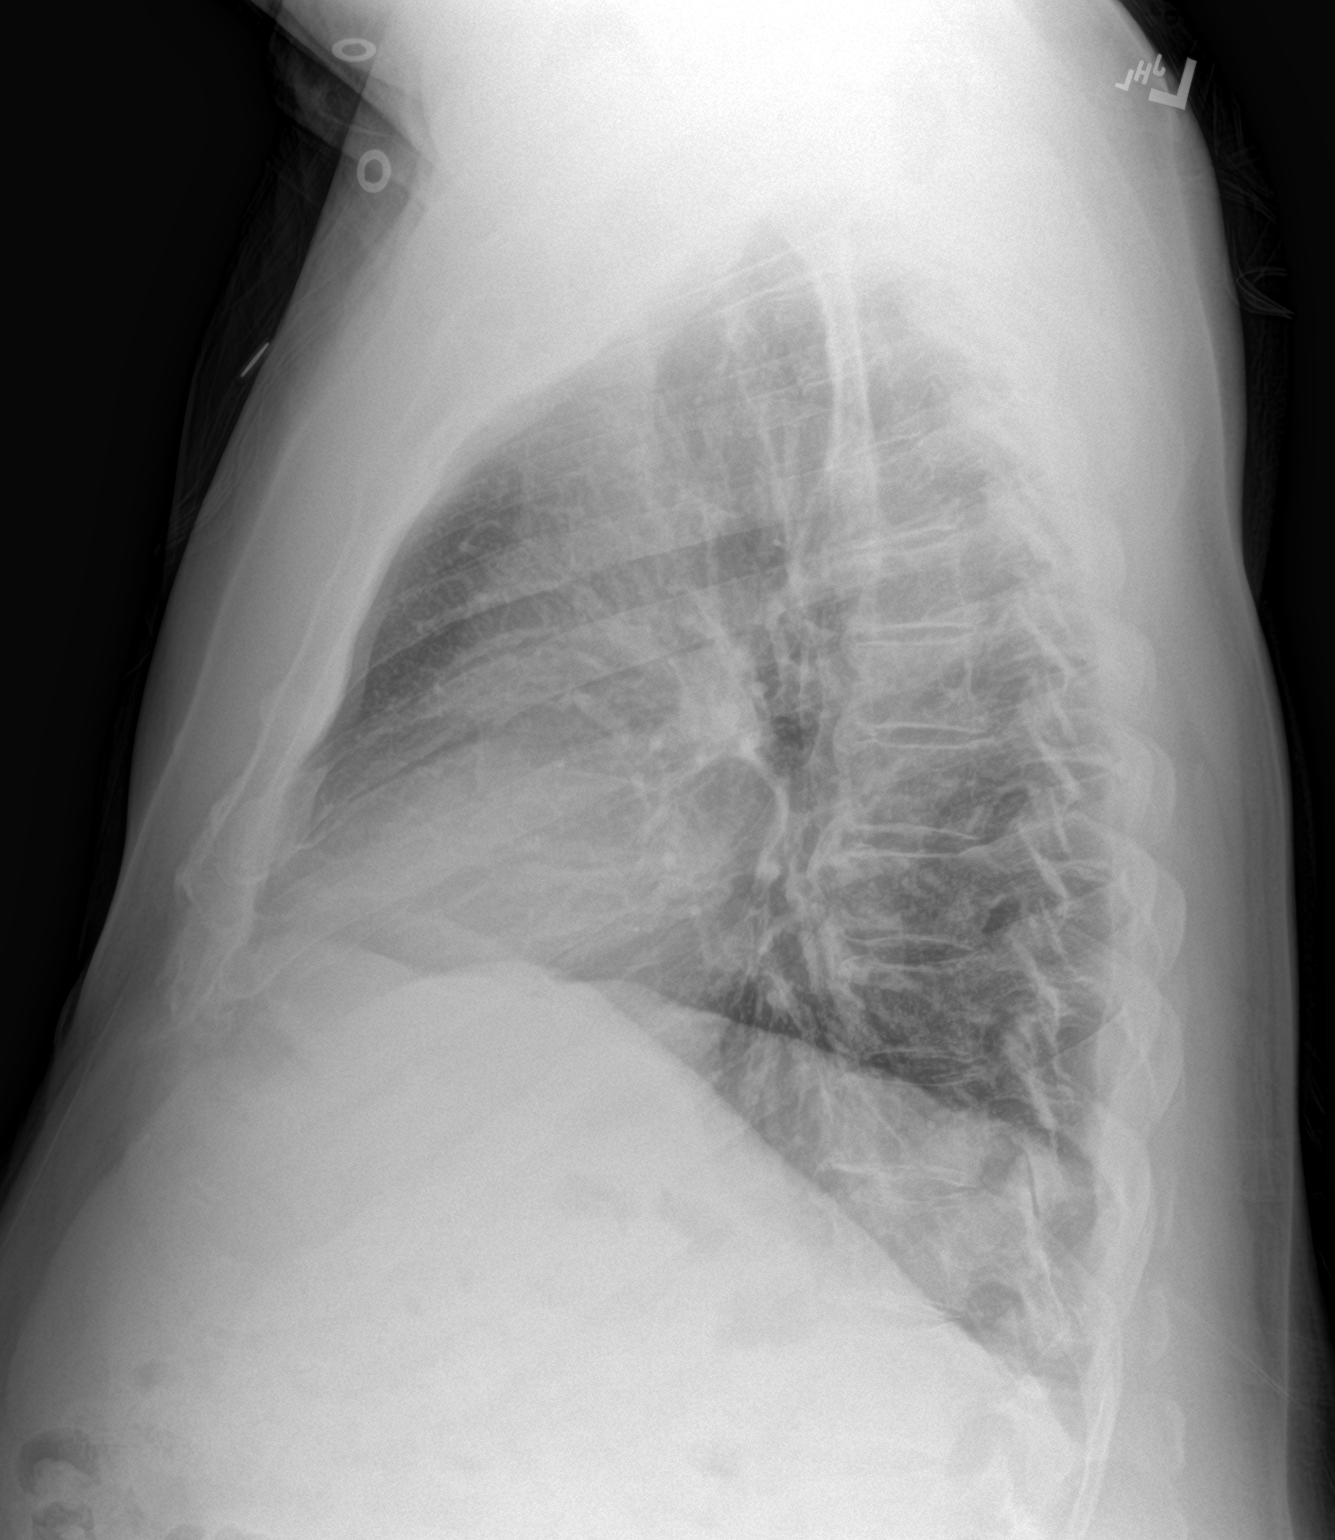

[2 of 2 positions shown; findings below may reference images not displayed]

FINDINGS: The heart size and mediastinal contours are within normal limits.
Both lungs are clear. The visualized skeletal structures are
unremarkable.
IMPRESSION: No active cardiopulmonary disease.

## 2023-05-25 LAB — EXTERNAL GENERIC LAB PROCEDURE: COLOGUARD: NEGATIVE

## 2023-10-29 ENCOUNTER — Other Ambulatory Visit: Payer: Self-pay

## 2023-10-29 ENCOUNTER — Emergency Department (HOSPITAL_COMMUNITY)
Admission: EM | Admit: 2023-10-29 | Discharge: 2023-10-29 | Disposition: A | Discharge status: Home / Self Care | Payer: Medicare HMO | Attending: Emergency Medicine | Admitting: Emergency Medicine

## 2023-10-29 DIAGNOSIS — J111 Influenza due to unidentified influenza virus with other respiratory manifestations: Secondary | ICD-10-CM | POA: Insufficient documentation

## 2023-10-29 DIAGNOSIS — Z20822 Contact with and (suspected) exposure to covid-19: Secondary | ICD-10-CM | POA: Insufficient documentation

## 2023-10-29 DIAGNOSIS — Z8546 Personal history of malignant neoplasm of prostate: Secondary | ICD-10-CM | POA: Diagnosis not present

## 2023-10-29 DIAGNOSIS — R509 Fever, unspecified: Secondary | ICD-10-CM | POA: Diagnosis present

## 2023-10-29 LAB — RESP PANEL BY RT-PCR (RSV, FLU A&B, COVID)  RVPGX2
Influenza A by PCR: POSITIVE — AB
Influenza B by PCR: NEGATIVE
Resp Syncytial Virus by PCR: NEGATIVE
SARS Coronavirus 2 by RT PCR: NEGATIVE

## 2023-10-29 MED ORDER — KETOROLAC TROMETHAMINE 15 MG/ML IJ SOLN
15.0000 mg | Freq: Once | INTRAMUSCULAR | Status: AC
  Administered 2023-10-29: 15 mg via INTRAMUSCULAR
  Filled 2023-10-29: qty 1

## 2023-10-29 MED ORDER — OSELTAMIVIR PHOSPHATE 75 MG PO CAPS: 75.0000 mg | ORAL_CAPSULE | Freq: Two times a day (BID) | ORAL | 0 refills | Status: AC

## 2023-10-29 MED ORDER — ONDANSETRON 4 MG PO TBDP: 4.0000 mg | ORAL_TABLET | Freq: Three times a day (TID) | ORAL | 0 refills | Status: AC | PRN

## 2023-10-29 MED ORDER — ONDANSETRON 4 MG PO TBDP
4.0000 mg | ORAL_TABLET | Freq: Once | ORAL | Status: AC
  Administered 2023-10-29: 4 mg via ORAL
  Filled 2023-10-29: qty 1

## 2023-10-29 MED ORDER — ACETAMINOPHEN 325 MG PO TABS
650.0000 mg | ORAL_TABLET | Freq: Once | ORAL | Status: AC
  Administered 2023-10-29: 650 mg via ORAL
  Filled 2023-10-29: qty 2

## 2023-10-29 NOTE — ED Provider Notes (Signed)
[place] EMERGENCY DEPARTMENT AT [facility] Provider Note   CSN: [id number] Arrival date & time: 10/29/23  1652     History  Chief Complaint  Patient presents with   URI    Roger Burgess is a 68 y.o. male with PMHx prostate cancer who presents to ED concerned for fever, headache, body aches, nausea, generalized weakness since yesterday. Denies recent vomiting. Patient stating that symptoms slammed him all at once. Endorses abdominal tightness when coughing but denies pain.    URI Presenting symptoms: fever        Home Medications Prior to Admission medications   Medication Sig Start Date End Date Taking? Authorizing Provider  ondansetron (ZOFRAN-ODT) 4 MG disintegrating tablet Take 1 tablet (4 mg total) by mouth every 8 (eight) hours as needed for up to 3 days for nausea. 10/29/23 11/01/23 Yes Dorthy Cooler, PA-C  oseltamivir (TAMIFLU) 75 MG capsule Take 1 capsule (75 mg total) by mouth every 12 (twelve) hours for 5 days. 10/29/23 11/03/23 Yes Dierdra Salameh, Charlotte Sanes F, PA-C  ibuprofen (ADVIL,MOTRIN) 200 MG tablet Take 600 mg by mouth every 6 (six) hours as needed for fever or moderate pain.    [provider]  meloxicam (MOBIC) 15 MG tablet daily. Patient not taking: Reported on 07/04/2021 07/28/20   [provider]  pantoprazole (PROTONIX) 40 MG tablet Take 40 mg by mouth daily. Patient not taking: Reported on 07/04/2021    [provider]      Allergies    Patient has no known allergies.    Review of Systems   Review of Systems  Constitutional:  Positive for fever.    Physical Exam Updated Vital Signs BP (!) 170/98 (BP Location: Right Arm)   Pulse 86   Temp (!) 101 F (38.3 C) (Oral)   Resp 16   Ht 5\' 11"  (1.803 m)   Wt 111.6 kg   SpO2 96%   BMI 34.31 kg/m  Physical Exam Vitals and nursing note reviewed.  Constitutional:      General: He is not in acute distress.    Appearance: He is not ill-appearing or  toxic-appearing.  HENT:     Head: Normocephalic and atraumatic.     Mouth/Throat:     Mouth: Mucous membranes are moist.  Eyes:     General: No scleral icterus.       Right eye: No discharge.        Left eye: No discharge.     Conjunctiva/sclera: Conjunctivae normal.  Cardiovascular:     Rate and Rhythm: Normal rate and regular rhythm.     Pulses: Normal pulses.     Heart sounds: Normal heart sounds. No murmur heard. Pulmonary:     Effort: Pulmonary effort is normal. No respiratory distress.     Breath sounds: Normal breath sounds. No wheezing, rhonchi or rales.  Abdominal:     General: Abdomen is flat. Bowel sounds are normal. There is no distension.     Palpations: Abdomen is soft. There is no mass.     Tenderness: There is no abdominal tenderness.  Musculoskeletal:     Right lower leg: No edema.     Left lower leg: No edema.  Skin:    General: Skin is warm and dry.     Findings: No rash.  Neurological:     General: No focal deficit present.     Mental Status: He is alert. Mental status is at baseline.  Psychiatric:  Mood and Affect: Mood normal.     ED Results / Procedures / Treatments   Labs (all labs ordered are listed, but only abnormal results are displayed) Labs Reviewed  RESP PANEL BY RT-PCR (RSV, FLU A&B, COVID)  RVPGX2 - Abnormal; Notable for the following components:      Result Value   Influenza A by PCR POSITIVE (*)    All other components within normal limits    EKG None  Radiology No results found.  Procedures Procedures    Medications Ordered in ED Medications  acetaminophen (TYLENOL) tablet 650 mg (650 mg Oral Given 10/29/23 1808)  ketorolac (TORADOL) 15 MG/ML injection 15 mg (15 mg Intramuscular Given 10/29/23 2109)  ondansetron (ZOFRAN-ODT) disintegrating tablet 4 mg (4 mg Oral Given 10/29/23 2110)    ED Course/ Medical Decision Making/ A&P                                 Medical Decision Making Risk OTC drugs. Prescription drug  management.    This patient presents to the ED for concern of fever, headache, body aches, nausea, generalized weakness, this involves an extensive number of treatment options, and is a complaint that carries with it a high risk of complications and morbidity.  The differential diagnosis includes Flu/COVID/RSV, sinusitis, pneumonia, meningitis.   Co morbidities that complicate the patient evaluation  none   Additional history obtained:  Dr. Katrinka Blazing PCP   Problem List / ED Course / Critical interventions / Medication management  Presents to ED concerned with fever, headache, body aches, nausea, generalized weakness since yesterday. Patient afebrile at 102.46F on arrival which resolved some with 650mg  Tylenol. Vitals without tachycardia or hypoxia. Physical exam reassuring.  I Ordered, and personally interpreted labs.  Resp panel positive for Flu A. Provided patient with a dose of Toradol and Zofran in ED which he tolerated well. Patient was educated on alternating between 650 mg Tylenol and 400 mg ibuprofen every 3 hours as needed for pain. Patient also educated on possible side effects of Tamiflu which I will prescribe. Family member asking for z-pack. Educated patient and family members that ABX are not appropriate for Flu. Lungs CTABL. Educated patient that he will need to follow up with PCP next week if symptoms are not resolving appropriately. Patient and family member verbalized understanding of plan. Staffed with Dr. Andria Meuse who agrees with plan. I have reviewed the patients home medicines and have made adjustments as needed Patient with stable vitals.  Provided with return precautions.  Discharged condition.   Social Determinants of Health:  geriatric         Final Clinical Impression(s) / ED Diagnoses Final diagnoses:  Flu    Rx / DC Orders ED Discharge Orders          Ordered    ondansetron (ZOFRAN-ODT) 4 MG disintegrating tablet  Every 8 hours PRN         10/29/23 2129    oseltamivir (TAMIFLU) 75 MG capsule  Every 12 hours        10/29/23 2129              Dorthy Cooler, PA-C 10/29/23 2144    Anders Simmonds T, DO 10/30/23 [phone]

## 2023-10-29 NOTE — ED Triage Notes (Signed)
Patient to ED by POV with c/o URI. Per patient symptoms started yesterday. He c/o headaches, body aches, N/V and weakness.

## 2023-10-29 NOTE — Discharge Instructions (Addendum)
It was a pleasure caring for you today. Please follow up with your primary care provider early next week.  Seek emergency care if experiencing any new or worsening symptoms.  Alternating between 650 mg Tylenol and 400 mg Advil: The best way to alternate taking Acetaminophen (example Tylenol) and Ibuprofen (example Advil/Motrin) is to take them 3 hours apart. For example, if you take ibuprofen at 6 am you can then take Tylenol at 9 am. You can continue this regimen throughout the day, making sure you do not exceed the recommended maximum dose for each drug.
